# Patient Record
Sex: Male | Born: 1955 | Race: White | Hispanic: No | Marital: Married | Smoking: Former smoker
Health system: Southern US, Community
[De-identification: ages and names within clinical notes are randomized; demographics above are authoritative.]

## PROBLEM LIST (undated history)

## (undated) DIAGNOSIS — Z87442 Personal history of urinary calculi: Secondary | ICD-10-CM

## (undated) DIAGNOSIS — M1990 Unspecified osteoarthritis, unspecified site: Secondary | ICD-10-CM

## (undated) HISTORY — PX: HERNIA REPAIR: SHX51

## (undated) HISTORY — PX: SHOULDER SURGERY: SHX246

---

## 2008-06-18 ENCOUNTER — Ambulatory Visit (HOSPITAL_BASED_OUTPATIENT_CLINIC_OR_DEPARTMENT_OTHER): Admission: RE | Admit: 2008-06-18 | Discharge: 2008-06-19 | Payer: Self-pay | Admitting: Orthopedic Surgery

## 2010-06-13 NOTE — Op Note (Signed)
NAME:  Benjamin Lynn, Benjamin Lynn NO.:  [id number]   MEDICAL RECORD NO.:  [id number]          PATIENT TYPE:  AMB   LOCATION:  DSC                          FACILITY:  [facility]   PHYSICIAN:  Harvie Junior, M.D.   DATE OF BIRTH:  [birth year]   DATE OF PROCEDURE:  06/18/2008  DATE OF DISCHARGE:                               OPERATIVE REPORT   PREOPERATIVE DIAGNOSES:  1. Rotator cuff tear.  2. Biceps tendon fray.  3. Impingement.  4. Acromioclavicular joint arthritis.   POSTOPERATIVE DIAGNOSES:  1. Rotator cuff tear.  2. Biceps tendon fray.  3. Impingement.  4. Acromioclavicular joint arthritis.   PERTINENT PROCEDURES:  1. Mini open rotator cuff repair.  2. Arthroscopic subacromial decompression from a lateral and posterior      compartment.  3. Open bicipital tenodesis.  4. Major debridement in the glenohumeral joint of the biceps tendon      and superior labrum tears.   SURGEON:  Harvie Junior, M.D.   ASSISTANT:  Marshia Ly, PA   ANESTHESIA:  General.   BRIEF HISTORY:  Mr. Baba is a 55 year old male with a long history of  having had significant right shoulder pain.  He had been treated  conservatively for a period of time.  Because of continued complaints of  pain, MRI was obtained which showed that he had a rotator cuff tear,  impingement AC joint arthritis, and there was some question about the  biceps tendon and subscap from his MRI as well.  We had a long  discussion with the patient about treatment options, ultimately felt  that given his age and activity level that it was most appropriate to  have him undergo rotator cuff repair and he is brought to the operating  room for this procedure.   PROCEDURE:  The patient was brought to the operating room and after  adequate anesthesia was obtained under general anesthetic, the patient  was placed supine on the operating table and moved to the beach chair  position, all bony prominences were well padded.   Attention was turned  to the right shoulder, it was prepped and draped in usual sterile  fashion and following this, routine arthroscopic examination of the  shoulder revealed that there was some significant fraying of the biceps  tendon at the superior labrum.  The superior labrum appeared to be torn.  Collene Mares was used in the superior labrum and really the biceps tendon with  minimal shaving, essentially was released completely from the superior  glenoid tubercle.  The superior labrum anterior to posterior was then  debrided at this level.  Attention was turned to the subscap in the  front, which looks to be well anchored to the bone.  Attention was  turned to the supraspinatus which did have some high-grade partial  thickness tearing on the undersurface.  We identified this area and the  fact that the biceps tendon was freed up and then we went up top to look  for a rotator cuff tear.  At this point, we moved out of the  glenohumeral joint into the subacromial space  and anterolateral  acromioplasty was performed from lateral and posterior compartment.  A  distal clavicle resection over 18 mm was performed from the anterior  compartment.  The attention was then turned to the rotator cuff from the  top side.  The rotator cuff unfortunately showed a full-thickness tear  from the top side and once we saw this we felt that given the biceps  tendon release on the inside that we needed a mini open rotator cuff  repair and this was undertaken.  Incision was made.  Subcutaneous tissue  was taken down to level of the deltoid fibers, which were divided in  line with the fibers and the retractor was put in place.  The torn  portion of the rotator cuff was then be identified and at this point,  this was freshened up around the edges.  We did open the bicipital  groove and fish out the torn portion of the biceps tendon and at this  point, the bicipital groove was verified and the suture anchor was   placed in the bicipital groove and the biceps tendon was tenodesed at  this level with a #2 FiberWire sutures x2.  Attention was then turned to  the supraspinatus bleeding edge tear which essentially went from the  midportion of the supraspinatus anteriorly to the biceps tendon.  Single  anchor was placed just medial to the glenoid and we roughened up the  bone just medial to the humeral head articular surface.  We roughened up  the bone from the humeral head articular surface to the greater  tuberosity.  At this point, the rotator cuff was easily mobile and a  suture anchor was placed just lateral to the articular margin and about  17 mm back from the edge of the rotator cuff, we placed 2 sutures in the  cuff giving excellent fixation and purchase to the suture anchor.  We  then used a PushLock anchor to give a double row repair over the  supraspinatus tendon.  At this point, the wound was copiously and  thoroughly irrigated.  A gloved finger was placed up on to the acromion.  Excellent decompression had been achieved at this point.  The wound was  again irrigated, suction dried.  The deltoid was closed with 1 Vicryl  running.  The skin with 0 and 2-0 Vicryl and 3-0 Monocryl subcuticular  stitch and Benzoin and Steri-Strips were applied.  Sterile compressive  dressing was applied, and the patient taken to the recovery room and was  noted to be in satisfactory condition.  Estimated blood loss for this  procedure was less than 25 mL.      Harvie Junior, M.D.  Electronically Signed     JLG/MEDQ  D:  06/18/2008  T:  06/19/2008  Job:  [id number]

## 2015-04-08 ENCOUNTER — Emergency Department (HOSPITAL_COMMUNITY)
Admission: EM | Admit: 2015-04-08 | Discharge: 2015-04-08 | Disposition: A | Discharge status: Home / Self Care | Payer: BLUE CROSS/BLUE SHIELD | Attending: Emergency Medicine | Admitting: Emergency Medicine

## 2015-04-08 ENCOUNTER — Emergency Department (HOSPITAL_COMMUNITY): Payer: BLUE CROSS/BLUE SHIELD

## 2015-04-08 ENCOUNTER — Encounter (HOSPITAL_COMMUNITY): Payer: Self-pay | Admitting: *Deleted

## 2015-04-08 DIAGNOSIS — Z87442 Personal history of urinary calculi: Secondary | ICD-10-CM | POA: Diagnosis not present

## 2015-04-08 DIAGNOSIS — Z87891 Personal history of nicotine dependence: Secondary | ICD-10-CM | POA: Insufficient documentation

## 2015-04-08 DIAGNOSIS — R079 Chest pain, unspecified: Secondary | ICD-10-CM | POA: Insufficient documentation

## 2015-04-08 LAB — D-DIMER, QUANTITATIVE (NOT AT ARMC)

## 2015-04-08 LAB — BASIC METABOLIC PANEL
ANION GAP: 11 (ref 5–15)
BUN: 14 mg/dL (ref 6–20)
CHLORIDE: 103 mmol/L (ref 101–111)
CO2: 25 mmol/L (ref 22–32)
Calcium: 9.5 mg/dL (ref 8.9–10.3)
Creatinine, Ser: 0.67 mg/dL (ref 0.61–1.24)
GFR calc non Af Amer: >60 mL/min (ref >60)
Glucose, Bld: 110 mg/dL — ABNORMAL HIGH (ref 65–99)
POTASSIUM: 4 mmol/L (ref 3.5–5.1)
SODIUM: 139 mmol/L (ref 135–145)

## 2015-04-08 LAB — CBC
HEMATOCRIT: 45.9 % (ref 39.0–52.0)
HEMOGLOBIN: 15.5 g/dL (ref 13.0–17.0)
MCH: 29.5 pg (ref 26.0–34.0)
MCHC: 33.8 g/dL (ref 30.0–36.0)
MCV: 87.3 fL (ref 78.0–100.0)
Platelets: 136 10*3/uL — ABNORMAL LOW (ref 150–400)
RBC: 5.26 MIL/uL (ref 4.22–5.81)
RDW: 13 % (ref 11.5–15.5)
WBC: 5.3 10*3/uL (ref 4.0–10.5)

## 2015-04-08 LAB — I-STAT TROPONIN, ED
TROPONIN I, POC: 0 ng/mL (ref 0.00–0.08)
Troponin i, poc: 0 ng/mL (ref 0.00–0.08)

## 2015-04-08 NOTE — ED Provider Notes (Addendum)
CSN: [id number]     Arrival date & time 04/08/15  1248 History   First MD Initiated Contact with Patient 04/08/15 1305     Chief Complaint  Patient presents with  . Chest Pain      HPI Patient presents with left-sided chest pain which started around 11 AM this morning.  Patient was at work and was not under any exertion.  Patient denies shortness of breath, nausea vomiting.  Denies diaphoresis.  Was given aspirin and nitroglycerin prior to arrival and chest pain is down to 1 out of 10. Past Medical History  Diagnosis Date  . Kidney stone    Past Surgical History  Procedure Laterality Date  . Shoulder surgery     Family History  Problem Relation Age of Onset  . Heart attack Father    Social History  Substance Use Topics  . Smoking status: Former Smoker    Quit date: 01/29/1993  . Smokeless tobacco: None  . Alcohol Use: No    Review of Systems  All other systems reviewed and are negative  Allergies  Review of patient's allergies indicates no known allergies.  Home Medications   Prior to Admission medications   Not on File   BP 122/69 mmHg  Pulse 75  Temp(Src) 98.2 F (36.8 C) (Oral)  Resp 16  SpO2 98% Physical Exam Physical Exam  Nursing note and vitals reviewed. Constitutional: He is oriented to person, place, and time. He appears well-developed and well-nourished. No distress.  HENT:  Head: Normocephalic and atraumatic.  Eyes: Pupils are equal, round, and reactive to light.  Neck: Normal range of motion.  Cardiovascular: Normal rate and intact distal pulses.   Pulmonary/Chest: No respiratory distress.  Abdominal: Normal appearance. He exhibits no distension.  Musculoskeletal: Normal range of motion.  Neurological: He is alert and oriented to person, place, and time. No cranial nerve deficit.  Skin: Skin is warm and dry. No rash noted.  Psychiatric: He has a normal mood and affect. His behavior is normal.   ED Course  Procedures (including critical  care time) Labs Review Labs Reviewed  CBC - Abnormal; Notable for the following:    Platelets 136 (*)    All other components within normal limits  BASIC METABOLIC PANEL  D-DIMER, QUANTITATIVE (NOT AT [facility])  Rosezena SensorI-STAT TROPOININ, ED    Imaging Review Dg Chest 2 View  04/08/2015  CLINICAL DATA:  Patient with left-sided chest pain. EXAM: CHEST  2 VIEW COMPARISON:  None. FINDINGS: Normal cardiac and mediastinal contours. No consolidative pulmonary opacities. No pleural effusion or pneumothorax. Thoracic spine degenerative changes. Osteolysis distal right clavicle. IMPRESSION: No active cardiopulmonary disease. Nonspecific osteolysis distal right clavicle, potentially secondary to posttraumatic deformity. Recommend clinical correlation. Electronically Signed   By: Annia Beltrew  Davis M.D.   On: 04/08/2015 13:18   I have personally reviewed and evaluated these images and lab results as part of my medical decision-making.   EKG Interpretation   Date/Time:  Friday April 08 2015 12:54:33 EST Ventricular Rate:  72 PR Interval:  129 QRS Duration: 105 QT Interval:  405 QTC Calculation: 443 R Axis:   70 Text Interpretation:  Sinus rhythm Ventricular premature complex Confirmed  by Radford PaxBEATON  MD, Michelle Vanhise ([id number]) on 04/08/2015 1:31:52 PM      HEART SCORE=3 MDM   Final diagnoses:  None        Nelva Nayobert Abriel Geesey, MD 04/08/15 1356  Nelva Nayobert Aayla Marrocco, MD 04/17/15 [phone]

## 2015-04-08 NOTE — Discharge Instructions (Signed)
Aspirin and Your Heart  Aspirin is a medicine that affects the way blood clots. Aspirin can be used to help reduce the risk of blood clots, heart attacks, and other heart-related problems.  SHOULD I TAKE ASPIRIN? Your health care provider will help you determine whether it is safe and beneficial for you to take aspirin daily. Taking aspirin daily may be beneficial if you:  Have had a heart attack or chest pain.  Have undergone open heart surgery such as coronary artery bypass surgery (CABG).  Have had coronary angioplasty.  Have experienced a stroke or transient ischemic attack (TIA).  Have peripheral vascular disease (PVD).  Have chronic heart rhythm problems such as atrial fibrillation. ARE THERE ANY RISKS OF TAKING ASPIRIN DAILY? Daily use of aspirin can increase your risk of side effects. Some of these include:  Bleeding. Bleeding problems can be minor or serious. An example of a minor problem is a cut that does not stop bleeding. An example of a more serious problem is stomach bleeding or bleeding into the brain. Your risk of bleeding is increased if you are also taking non-steroidal anti-inflammatory medicine (NSAIDs).  Increased bruising.  Upset stomach.  An allergic reaction. People who have nasal polyps have an increased risk of developing an aspirin allergy. WHAT ARE SOME GUIDELINES I SHOULD FOLLOW WHEN TAKING ASPIRIN?   Take aspirin only as directed by your health care provider. Make sure you understand how much you should take and what form you should take. The two forms of aspirin are:  Non-enteric-coated. This type of aspirin does not have a coating and is absorbed quickly. Non-enteric-coated aspirin is usually recommended for people with chest pain. This type of aspirin also comes in a chewable form.  Enteric-coated. This type of aspirin has a special coating that releases the medicine very slowly. Enteric-coated aspirin causes less stomach upset than non-enteric-coated  aspirin. This type of aspirin should not be chewed or crushed.  Drink alcohol in moderation. Drinking alcohol increases your risk of bleeding. WHEN SHOULD I SEEK MEDICAL CARE?   You have unusual bleeding or bruising.  You have stomach pain.  You have an allergic reaction. Symptoms of an allergic reaction include:  Hives.  Itchy skin.  Swelling of the lips, tongue, or face.  You have ringing in your ears. WHEN SHOULD I SEEK IMMEDIATE MEDICAL CARE?   Your bowel movements are bloody, dark red, or black in color.  You vomit or cough up blood.  You have blood in your urine.  You cough, wheeze, or feel short of breath. If you have any of the following symptoms, this is an emergency. Do not wait to see if the pain will go away. Get medical help at once. Call your local emergency services (911 in the [place] Do not drive yourself to the hospital.  You have severe chest pain, especially if the pain is crushing or pressure-like and spreads to the arms, back, neck, or jaw.  You have stroke-like symptoms, such as:   Loss of vision.   Difficulty talking.   Numbness or weakness on one side of your body.   Numbness or weakness in your arm or leg.   Not thinking clearly or feeling confused.    This information is not intended to replace advice given to you by your health care provider. Make sure you discuss any questions you have with your health care provider.   Document Released: 12/29/2007 Document Revised: 02/05/2014 Document Reviewed: 04/22/2013 Elsevier Interactive Patient Education 2016 Elsevier  Inc. ° °Nonspecific Chest Pain °It is often hard to find the cause of chest pain. There is always a chance that your pain could be related to something serious, such as a heart attack or a blood clot in your lungs. Chest pain can also be caused by conditions that are not life-threatening. If you have chest pain, it is very important to follow up with your doctor. ° °HOME CARE °· If  you were prescribed an antibiotic medicine, finish it all even if you start to feel better. °· Avoid any activities that cause chest pain. °· Do not use any tobacco products, including cigarettes, chewing tobacco, or electronic cigarettes. If you need help quitting, ask your doctor. °· Do not drink alcohol. °· Take medicines only as told by your doctor. °· Keep all follow-up visits as told by your doctor. This is important. This includes any further testing if your chest pain does not go away. °· Your doctor may tell you to keep your head raised (elevated) while you sleep. °· Make lifestyle changes as told by your doctor. These may include: °¨ Getting regular exercise. Ask your doctor to suggest some activities that are safe for you. °¨ Eating a heart-healthy diet. Your doctor or a diet specialist (dietitian) can help you to learn healthy eating options. °¨ Maintaining a healthy weight. °¨ Managing diabetes, if necessary. °¨ Reducing stress. °GET HELP IF: °· Your chest pain does not go away, even after treatment. °· You have a rash with blisters on your chest. °· You have a fever. °GET HELP RIGHT AWAY IF: °· Your chest pain is worse. °· You have an increasing cough, or you cough up blood. °· You have severe belly (abdominal) pain. °· You feel extremely weak. °· You pass out (faint). °· You have chills. °· You have sudden, unexplained chest discomfort. °· You have sudden, unexplained discomfort in your arms, back, neck, or jaw. °· You have shortness of breath at any time. °· You suddenly start to sweat, or your skin gets clammy. °· You feel nauseous. °· You vomit. °· You suddenly feel light-headed or dizzy. °· Your heart begins to beat quickly, or it feels like it is skipping beats. °These symptoms may be an emergency. Do not wait to see if the symptoms will go away. Get medical help right away. Call your local emergency services (911 in the [place] Do not drive yourself to the hospital. °  °This information is not  intended to replace advice given to you by your health care provider. Make sure you discuss any questions you have with your health care provider. °  °Document Released: 07/04/2007 Document Revised: 02/05/2014 Document Reviewed: 08/21/2013 °Elsevier Interactive Patient Education ©2016 Elsevier Inc. ° °

## 2015-04-08 NOTE — ED Notes (Signed)
Patient placed back on monitor, continuous pulse oximetry and blood pressure cuff; visitor at bedside; Radford PaxBeaton, MD speaking with patient at bedside as well

## 2015-04-08 NOTE — ED Notes (Addendum)
Patient undressed, in gown, on monitor, continuous pulse oximetry and blood pressure cuff 

## 2015-04-08 NOTE — ED Provider Notes (Signed)
Sign off from Dr. Radford PaxBeaton. Patient with resolved chest pain awaiting second troponin.  Second troponin is negative. He remains pain-free. Will be discharged according to plan established by Dr. Radford PaxBeaton.  Benjamin OctaveStephen Meghna Hagmann, MD 04/08/15 [phone]

## 2015-04-08 NOTE — ED Notes (Signed)
Patient transported to X-ray 

## 2015-04-08 NOTE — ED Notes (Signed)
Pt reports left sided chest pain that started while at work today approx 2hrs pta. Pt states he was at rest at onset of chest pain, denies SOB, N/V or dizziness. Pt took 324mg  ASA prior to EMS arrival - x1 nitro given by EMS en route, pt down from 5/10 to 3/10 chest pain.

## 2016-08-08 ENCOUNTER — Encounter (HOSPITAL_COMMUNITY): Payer: Self-pay | Admitting: *Deleted

## 2016-08-10 ENCOUNTER — Ambulatory Visit: Payer: Self-pay | Admitting: Orthopedic Surgery

## 2016-08-15 NOTE — H&P (Signed)
TOTAL KNEE ADMISSION H&P  Patient is being admitted for right total knee arthroplasty.  Subjective:  Chief Complaint:right knee pain.  HPI: Benjamin Lynn, 61 y.o. male, has a history of pain and functional disability in the right knee due to arthritis and has failed non-surgical conservative treatments for greater than 12 weeks to includecorticosteriod injections, viscosupplementation injections, supervised PT with diminished ADL's post treatment, use of assistive devices, weight reduction as appropriate and activity modification.  Onset of symptoms was gradual, starting 2 years ago with gradually worsening course since that time. The patient noted no past surgery on the right knee(s).  Patient currently rates pain in the right knee(s) at 8 out of 10 with activity. Patient has worsening of pain with activity and weight bearing, pain that interferes with activities of daily living, pain with passive range of motion and joint swelling.  Patient has evidence of subchondral sclerosis, periarticular osteophytes and joint space narrowing by imaging studies. This patient has had previous injury to right knee. There is no active infection.  There are no active problems to display for this patient.  Past Medical History:  Diagnosis Date  . Kidney stone     Past Surgical History:  Procedure Laterality Date  . SHOULDER SURGERY      No prescriptions prior to admission.   No Known Allergies  Social History  Substance Use Topics  . Smoking status: Former Smoker    Quit date: 01/29/1993  . Smokeless tobacco: Not on file  . Alcohol use No    Family History  Problem Relation Age of Onset  . Heart attack Father      Review of Systems  Constitutional: Negative.   HENT: Negative.   Eyes: Negative.   Respiratory: Negative.   Cardiovascular: Negative.   Gastrointestinal: Negative.   Genitourinary: Negative.   Musculoskeletal: Positive for back pain and joint pain.  Skin: Negative.    Neurological: Negative.   Endo/Heme/Allergies: Negative.   Psychiatric/Behavioral: Negative.     Objective:  Physical Exam  Constitutional: He is oriented to person, place, and time. He appears well-developed.  HENT:  Head: Normocephalic.  Eyes: EOM are normal.  Neck: Normal range of motion.  Cardiovascular: Normal rate.   Respiratory: Effort normal.  GI: Soft.  Genitourinary:  Genitourinary Comments: Deferred  Musculoskeletal:  Right knee limited rom. RLE grossly n/v intact. Right knee stable.  Neurological: He is alert and oriented to person, place, and time.  Skin: Skin is warm and dry.  Psychiatric: His behavior is normal.    Vital signs in last 24 hours: BP: ()/()  Arterial Line BP: ()/()   Labs:   There is no height or weight on file to calculate BMI.   Imaging Review Plain radiographs demonstrate moderate degenerative joint disease of the right knee(s). The overall alignment ismild varus. The bone quality appears to be good for age and reported activity level.  Assessment/Plan:  End stage arthritis, right knee   The patient history, physical examination, clinical judgment of the provider and imaging studies are consistent with end stage degenerative joint disease of the right knee(s) and total knee arthroplasty is deemed medically necessary. The treatment options including medical management, injection therapy arthroscopy and arthroplasty were discussed at length. The risks and benefits of total knee arthroplasty were presented and reviewed. The risks due to aseptic loosening, infection, stiffness, patella tracking problems, thromboembolic complications and other imponderables were discussed. The patient acknowledged the explanation, agreed to proceed with the plan and consent was signed. Patient  is being admitted for inpatient treatment for surgery, pain control, PT, OT, prophylactic antibiotics, VTE prophylaxis, progressive ambulation and ADL's and discharge  planning. The patient is planning to be discharged home with home health services.  Will use IV tranexamic acid. Contraindications and adverse affects of Tranexamic acid discussed in detail. Patient denies any of these at this time and understands the risks and benefits.

## 2016-08-21 NOTE — Patient Instructions (Addendum)
Alleen BorneKenneth Derosia  08/21/2016   Your procedure is scheduled on: 08/30/2016   [facility]  Entrance [facility] [place]  elevators to 3rd floor to  Short Stay Center at    1030 AM.   Call this number if you have problems the morning of surgery [phone]    Remember: ONLY 1 PERSON MAY GO WITH YOU TO SHORT STAY TO GET  READY MORNING OF YOUR SURGERY.  Do not eat food or drink liquids :After Midnight.     Take these medicines the morning of surgery with A SIP OF WATER: none                                 You may not have any metal on your body including hair pins and              piercings  Do not wear jewelry,  lotions, powders or perfumes, deodorant            .              Men may shave face and neck.   Do not bring valuables to the hospital. [facility] IS NOT             RESPONSIBLE   FOR VALUABLES.  Contacts, dentures or bridgework may not be worn into surgery.  Leave suitcase in the car. After surgery it may be brought to your room.                   Please read over the following fact sheets you were given: _____________________________________________________________________             [facility] - Preparing for Surgery Before surgery, you can play an important role.  Because skin is not sterile, your skin needs to be as free of germs as possible.  You can reduce the number of germs on your skin by washing with CHG (chlorahexidine gluconate) soap before surgery.  CHG is an antiseptic cleaner which kills germs and bonds with the skin to continue killing germs even after washing. Please DO NOT use if you have an allergy to CHG or antibacterial soaps.  If your skin becomes reddened/irritated stop using the CHG and inform your nurse when you arrive at Short Stay. Do not shave (including legs and underarms) for at least 48 hours prior to the first CHG shower.  You may shave your face/neck. Please follow these instructions carefully:  1.  Shower  with CHG Soap the night before surgery and the  morning of Surgery.  2.  If you choose to wash your hair, wash your hair first as usual with your  normal  shampoo.  3.  After you shampoo, rinse your hair and body thoroughly to remove the  shampoo.                           4.  Use CHG as you would any other liquid soap.  You can apply chg directly  to the skin and wash                       Gently with a scrungie or clean washcloth.  5.  Apply the CHG Soap to your body ONLY FROM THE NECK DOWN.  Do not use on face/ open                           Wound or open sores. Avoid contact with eyes, ears mouth and genitals (private parts).                       Wash face,  Genitals (private parts) with your normal soap.             6.  Wash thoroughly, paying special attention to the area where your surgery  will be performed.  7.  Thoroughly rinse your body with warm water from the neck down.  8.  DO NOT shower/wash with your normal soap after using and rinsing off  the CHG Soap.                9.  Pat yourself dry with a clean towel.            10.  Wear clean pajamas.            11.  Place clean sheets on your bed the night of your first shower and do not  sleep with pets. Day of Surgery : Do not apply any lotions/deodorants the morning of surgery.  Please wear clean clothes to the hospital/surgery center.  FAILURE TO FOLLOW THESE INSTRUCTIONS MAY RESULT IN THE CANCELLATION OF YOUR SURGERY PATIENT SIGNATURE_________________________________  NURSE SIGNATURE__________________________________  ________________________________________________________________________  WHAT IS A BLOOD TRANSFUSION? Blood Transfusion Information  A transfusion is the replacement of blood or some of its parts. Blood is made up of multiple cells which provide different functions.  Red blood cells carry oxygen and are used for blood loss replacement.  White blood cells fight against infection.  Platelets control  bleeding.  Plasma helps clot blood.  Other blood products are available for specialized needs, such as hemophilia or other clotting disorders. BEFORE THE TRANSFUSION  Who gives blood for transfusions?   Healthy volunteers who are fully evaluated to make sure their blood is safe. This is blood bank blood. Transfusion therapy is the safest it has ever been in the practice of medicine. Before blood is taken from a donor, a complete history is taken to make sure that person has no history of diseases nor engages in risky social behavior (examples are intravenous drug use or sexual activity with multiple partners). The donor's travel history is screened to minimize risk of transmitting infections, such as malaria. The donated blood is tested for signs of infectious diseases, such as HIV and hepatitis. The blood is then tested to be sure it is compatible with you in order to minimize the chance of a transfusion reaction. If you or a relative donates blood, this is often done in anticipation of surgery and is not appropriate for emergency situations. It takes many days to process the donated blood. RISKS AND COMPLICATIONS Although transfusion therapy is very safe and saves many lives, the main dangers of transfusion include:   Getting an infectious disease.  Developing a transfusion reaction. This is an allergic reaction to something in the blood you were given. Every precaution is taken to prevent this. The decision to have a blood transfusion has been considered carefully by your caregiver before blood is given. Blood is not given unless the benefits outweigh the risks. AFTER THE TRANSFUSION  Right after receiving a blood transfusion, you will usually feel much better and more energetic. This is especially  true if your red blood cells have gotten low (anemic). The transfusion raises the level of the red blood cells which carry oxygen, and this usually causes an energy increase.  The nurse  administering the transfusion will monitor you carefully for complications. HOME CARE INSTRUCTIONS  No special instructions are needed after a transfusion. You may find your energy is better. Speak with your caregiver about any limitations on activity for underlying diseases you may have. SEEK MEDICAL CARE IF:   Your condition is not improving after your transfusion.  You develop redness or irritation at the intravenous (IV) site. SEEK IMMEDIATE MEDICAL CARE IF:  Any of the following symptoms occur over the next 12 hours:  Shaking chills.  You have a temperature by mouth above 102 F (38.9 C), not controlled by medicine.  Chest, back, or muscle pain.  People around you feel you are not acting correctly or are confused.  Shortness of breath or difficulty breathing.  Dizziness and fainting.  You get a rash or develop hives.  You have a decrease in urine output.  Your urine turns a dark color or changes to pink, red, or brown. Any of the following symptoms occur over the next 10 days:  You have a temperature by mouth above 102 F (38.9 C), not controlled by medicine.  Shortness of breath.  Weakness after normal activity.  The white part of the eye turns yellow (jaundice).  You have a decrease in the amount of urine or are urinating less often.  Your urine turns a dark color or changes to pink, red, or brown. Document Released: 01/13/2000 Document Revised: 04/09/2011 Document Reviewed: 09/01/2007 ExitCare Patient Information 2014 [place].  _______________________________________________________________________  Incentive Spirometer  An incentive spirometer is a tool that can help keep your lungs clear and active. This tool measures how well you are filling your lungs with each breath. Taking long deep breaths may help reverse or decrease the chance of developing breathing (pulmonary) problems (especially infection) following:  A long period of time when you are  unable to move or be active. BEFORE THE PROCEDURE   If the spirometer includes an indicator to show your best effort, your nurse or respiratory therapist will set it to a desired goal.  If possible, sit up straight or lean slightly forward. Try not to slouch.  Hold the incentive spirometer in an upright position. INSTRUCTIONS FOR USE  1. Sit on the edge of your bed if possible, or sit up as far as you can in bed or on a chair. 2. Hold the incentive spirometer in an upright position. 3. Breathe out normally. 4. Place the mouthpiece in your mouth and seal your lips tightly around it. 5. Breathe in slowly and as deeply as possible, raising the piston or the ball toward the top of the column. 6. Hold your breath for 3-5 seconds or for as long as possible. Allow the piston or ball to fall to the bottom of the column. 7. Remove the mouthpiece from your mouth and breathe out normally. 8. Rest for a few seconds and repeat Steps 1 through 7 at least 10 times every 1-2 hours when you are awake. Take your time and take a few normal breaths between deep breaths. 9. The spirometer may include an indicator to show your best effort. Use the indicator as a goal to work toward during each repetition. 10. After each set of 10 deep breaths, practice coughing to be sure your lungs are clear. If you have  an incision (the cut made at the time of surgery), support your incision when coughing by placing a pillow or rolled up towels firmly against it. Once you are able to get out of bed, walk around indoors and cough well. You may stop using the incentive spirometer when instructed by your caregiver.  RISKS AND COMPLICATIONS  Take your time so you do not get dizzy or light-headed.  If you are in pain, you may need to take or ask for pain medication before doing incentive spirometry. It is harder to take a deep breath if you are having pain. AFTER USE  Rest and breathe slowly and easily.  It can be helpful to  keep track of a log of your progress. Your caregiver can provide you with a simple table to help with this. If you are using the spirometer at home, follow these instructions: [place] IF:   You are having difficultly using the spirometer.  You have trouble using the spirometer as often as instructed.  Your pain medication is not giving enough relief while using the spirometer.  You develop fever of 100.5 F (38.1 C) or higher. SEEK IMMEDIATE MEDICAL CARE IF:   You cough up bloody sputum that had not been present before.  You develop fever of 102 F (38.9 C) or greater.  You develop worsening pain at or near the incision site. MAKE SURE YOU:   Understand these instructions.  Will watch your condition.  Will get help right away if you are not doing well or get worse. Document Released: 05/28/2006 Document Revised: 04/09/2011 Document Reviewed: 07/29/2006 [facility] Patient Information 2014 [place], [place].   ________________________________________________________________________

## 2016-08-22 ENCOUNTER — Encounter (HOSPITAL_COMMUNITY)
Admission: RE | Admit: 2016-08-22 | Discharge: 2016-08-22 | Disposition: A | Discharge status: Home / Self Care | Payer: BLUE CROSS/BLUE SHIELD | Source: Ambulatory Visit | Attending: Specialist | Admitting: Specialist

## 2016-08-22 ENCOUNTER — Encounter (HOSPITAL_COMMUNITY): Payer: Self-pay

## 2016-08-22 DIAGNOSIS — Z01812 Encounter for preprocedural laboratory examination: Secondary | ICD-10-CM | POA: Insufficient documentation

## 2016-08-22 HISTORY — DX: Unspecified osteoarthritis, unspecified site: M19.90

## 2016-08-22 HISTORY — DX: Personal history of urinary calculi: Z87.442

## 2016-08-22 LAB — URINALYSIS, ROUTINE W REFLEX MICROSCOPIC
BILIRUBIN URINE: NEGATIVE
Glucose, UA: 50 mg/dL — AB
Hgb urine dipstick: NEGATIVE
Ketones, ur: 5 mg/dL — AB
Nitrite: NEGATIVE
Protein, ur: NEGATIVE mg/dL
SPECIFIC GRAVITY, URINE: 1.034 — AB (ref 1.005–1.030)
pH: 5 (ref 5.0–8.0)

## 2016-08-22 LAB — ABO/RH: ABO/RH(D): O POS

## 2016-08-22 LAB — PROTIME-INR
INR: 0.99
PROTHROMBIN TIME: 13.1 s (ref 11.4–15.2)

## 2016-08-22 LAB — CBC
HCT: 42.3 % (ref 39.0–52.0)
HEMOGLOBIN: 14.6 g/dL (ref 13.0–17.0)
MCH: 29.7 pg (ref 26.0–34.0)
MCHC: 34.5 g/dL (ref 30.0–36.0)
MCV: 86.2 fL (ref 78.0–100.0)
Platelets: 145 10*3/uL — ABNORMAL LOW (ref 150–400)
RBC: 4.91 MIL/uL (ref 4.22–5.81)
RDW: 13.2 % (ref 11.5–15.5)
WBC: 12.5 10*3/uL — ABNORMAL HIGH (ref 4.0–10.5)

## 2016-08-22 LAB — BASIC METABOLIC PANEL
ANION GAP: 9 (ref 5–15)
BUN: 16 mg/dL (ref 6–20)
CALCIUM: 9.2 mg/dL (ref 8.9–10.3)
CO2: 26 mmol/L (ref 22–32)
Chloride: 104 mmol/L (ref 101–111)
Creatinine, Ser: 0.68 mg/dL (ref 0.61–1.24)
Glucose, Bld: 110 mg/dL — ABNORMAL HIGH (ref 65–99)
Potassium: 4.1 mmol/L (ref 3.5–5.1)
Sodium: 139 mmol/L (ref 135–145)

## 2016-08-22 LAB — APTT: APTT: 29 s (ref 24–36)

## 2016-08-22 LAB — SURGICAL PCR SCREEN
MRSA, PCR: NEGATIVE
STAPHYLOCOCCUS AUREUS: NEGATIVE

## 2016-08-22 NOTE — Progress Notes (Addendum)
CBC and UA  done 08/22/16 faxed via epic to dr Thomasena Edisollins.

## 2016-08-23 ENCOUNTER — Ambulatory Visit: Payer: Self-pay | Admitting: Orthopedic Surgery

## 2016-08-23 NOTE — Progress Notes (Signed)
Clearance- Dr Delorise ShinerSeagle- 04/18/16-on chart

## 2016-08-29 NOTE — Progress Notes (Signed)
Spoke to patient and informed him of time change for surgery on 08/30/2016 and to arrive at Admitting at 1130 and may have clear liquids from midnight up until 0730 am then nothing after 0730 am until after surgery. Patient verbalized understanding.

## 2016-08-30 ENCOUNTER — Inpatient Hospital Stay (HOSPITAL_COMMUNITY)
Admission: RE | Admit: 2016-08-30 | Discharge: 2016-09-01 | DRG: 470 | Disposition: A | Discharge status: Home / Self Care | Payer: BLUE CROSS/BLUE SHIELD | Attending: Specialist | Admitting: Specialist

## 2016-08-30 ENCOUNTER — Inpatient Hospital Stay (HOSPITAL_COMMUNITY): Payer: BLUE CROSS/BLUE SHIELD | Admitting: Anesthesiology

## 2016-08-30 ENCOUNTER — Encounter (HOSPITAL_COMMUNITY)
Admission: RE | Disposition: A | Discharge status: Home / Self Care | Payer: Self-pay | Source: Home / Self Care | Attending: Specialist

## 2016-08-30 ENCOUNTER — Encounter (HOSPITAL_COMMUNITY): Payer: Self-pay | Admitting: *Deleted

## 2016-08-30 DIAGNOSIS — M1711 Unilateral primary osteoarthritis, right knee: Principal | ICD-10-CM

## 2016-08-30 DIAGNOSIS — K219 Gastro-esophageal reflux disease without esophagitis: Secondary | ICD-10-CM | POA: Diagnosis present

## 2016-08-30 DIAGNOSIS — Z6837 Body mass index (BMI) 37.0-37.9, adult: Secondary | ICD-10-CM

## 2016-08-30 DIAGNOSIS — D696 Thrombocytopenia, unspecified: Secondary | ICD-10-CM | POA: Diagnosis present

## 2016-08-30 DIAGNOSIS — Z96659 Presence of unspecified artificial knee joint: Secondary | ICD-10-CM

## 2016-08-30 DIAGNOSIS — Z87891 Personal history of nicotine dependence: Secondary | ICD-10-CM | POA: Diagnosis not present

## 2016-08-30 DIAGNOSIS — Z96651 Presence of right artificial knee joint: Secondary | ICD-10-CM

## 2016-08-30 HISTORY — PX: TOTAL KNEE ARTHROPLASTY: SHX125

## 2016-08-30 LAB — TYPE AND SCREEN
ABO/RH(D): O POS
ANTIBODY SCREEN: NEGATIVE

## 2016-08-30 SURGERY — ARTHROPLASTY, KNEE, TOTAL
Anesthesia: Spinal | Site: Knee | Laterality: Right

## 2016-08-30 MED ORDER — ASPIRIN EC 325 MG PO TBEC: 325.0000 mg | DELAYED_RELEASE_TABLET | Freq: Two times a day (BID) | ORAL | 0 refills | Status: AC

## 2016-08-30 MED ORDER — CELECOXIB 200 MG PO CAPS
200.0000 mg | ORAL_CAPSULE | Freq: Once | ORAL | Status: AC
  Administered 2016-08-30: 200 mg via ORAL

## 2016-08-30 MED ORDER — FENTANYL CITRATE (PF) 100 MCG/2ML IJ SOLN: 25.0000 ug | INTRAMUSCULAR | Status: DC | PRN

## 2016-08-30 MED ORDER — TRAZODONE HCL 50 MG PO TABS
50.0000 mg | ORAL_TABLET | Freq: Every day | ORAL | Status: DC
  Administered 2016-08-30: 50 mg via ORAL
  Filled 2016-08-30 (×2): qty 1

## 2016-08-30 MED ORDER — ONDANSETRON HCL 4 MG/2ML IJ SOLN
INTRAMUSCULAR | Status: DC | PRN
  Administered 2016-08-30: 4 mg via INTRAVENOUS

## 2016-08-30 MED ORDER — LACTATED RINGERS IV SOLN
INTRAVENOUS | Status: DC
  Administered 2016-08-30 – 2016-08-31 (×5): via INTRAVENOUS

## 2016-08-30 MED ORDER — METOCLOPRAMIDE HCL 5 MG PO TABS: 5.0000 mg | ORAL_TABLET | Freq: Three times a day (TID) | ORAL | Status: DC | PRN

## 2016-08-30 MED ORDER — PHENYLEPHRINE 40 MCG/ML (10ML) SYRINGE FOR IV PUSH (FOR BLOOD PRESSURE SUPPORT)
PREFILLED_SYRINGE | INTRAVENOUS | Status: DC | PRN
  Administered 2016-08-30 (×3): 80 ug via INTRAVENOUS

## 2016-08-30 MED ORDER — ALUM & MAG HYDROXIDE-SIMETH 200-200-20 MG/5ML PO SUSP
30.0000 mL | ORAL | Status: DC | PRN
  Administered 2016-08-31: 30 mL via ORAL
  Filled 2016-08-30: qty 30

## 2016-08-30 MED ORDER — HYDROMORPHONE HCL-NACL 0.5-0.9 MG/ML-% IV SOSY: 1.0000 mg | PREFILLED_SYRINGE | INTRAVENOUS | Status: DC | PRN

## 2016-08-30 MED ORDER — DEXAMETHASONE SODIUM PHOSPHATE 10 MG/ML IJ SOLN: 10.0000 mg | Freq: Once | INTRAMUSCULAR | Status: AC

## 2016-08-30 MED ORDER — FENTANYL CITRATE (PF) 100 MCG/2ML IJ SOLN
INTRAMUSCULAR | Status: AC
  Administered 2016-08-30: 50 ug via INTRAVENOUS
  Filled 2016-08-30: qty 2

## 2016-08-30 MED ORDER — METHOCARBAMOL 1000 MG/10ML IJ SOLN
500.0000 mg | Freq: Four times a day (QID) | INTRAVENOUS | Status: DC | PRN
  Filled 2016-08-30: qty 5

## 2016-08-30 MED ORDER — GABAPENTIN 300 MG PO CAPS
ORAL_CAPSULE | ORAL | Status: AC
  Administered 2016-08-30: 300 mg via ORAL
  Filled 2016-08-30: qty 1

## 2016-08-30 MED ORDER — MIDAZOLAM HCL 2 MG/2ML IJ SOLN
INTRAMUSCULAR | Status: AC
Start: 2016-08-30 — End: 2016-08-30
  Filled 2016-08-30: qty 2

## 2016-08-30 MED ORDER — TEMAZEPAM 15 MG PO CAPS: 15.0000 mg | ORAL_CAPSULE | Freq: Every evening | ORAL | Status: DC | PRN

## 2016-08-30 MED ORDER — PHENOL 1.4 % MT LIQD: 1.0000 | OROMUCOSAL | Status: DC | PRN

## 2016-08-30 MED ORDER — ONDANSETRON HCL 4 MG PO TABS: 4.0000 mg | ORAL_TABLET | Freq: Four times a day (QID) | ORAL | Status: DC | PRN

## 2016-08-30 MED ORDER — PROPOFOL 10 MG/ML IV BOLUS
INTRAVENOUS | Status: AC
  Filled 2016-08-30: qty 20

## 2016-08-30 MED ORDER — MENTHOL 3 MG MT LOZG: 1.0000 | LOZENGE | OROMUCOSAL | Status: DC | PRN

## 2016-08-30 MED ORDER — DEXAMETHASONE SODIUM PHOSPHATE 10 MG/ML IJ SOLN
10.0000 mg | Freq: Once | INTRAMUSCULAR | Status: AC
  Administered 2016-08-30: 10 mg via INTRAVENOUS

## 2016-08-30 MED ORDER — KETOROLAC TROMETHAMINE 30 MG/ML IJ SOLN
INTRAMUSCULAR | Status: DC | PRN
  Administered 2016-08-30: 30 mg via INTRA_ARTICULAR

## 2016-08-30 MED ORDER — STERILE WATER FOR IRRIGATION IR SOLN
Status: DC | PRN
  Administered 2016-08-30: 2000 mL

## 2016-08-30 MED ORDER — GABAPENTIN 300 MG PO CAPS
300.0000 mg | ORAL_CAPSULE | Freq: Once | ORAL | Status: AC
  Administered 2016-08-30: 300 mg via ORAL

## 2016-08-30 MED ORDER — ONDANSETRON HCL 4 MG/2ML IJ SOLN
INTRAMUSCULAR | Status: AC
  Filled 2016-08-30: qty 2

## 2016-08-30 MED ORDER — LIDOCAINE 2% (20 MG/ML) 5 ML SYRINGE
INTRAMUSCULAR | Status: AC
  Filled 2016-08-30: qty 5

## 2016-08-30 MED ORDER — SODIUM CHLORIDE 0.9 % IR SOLN
Status: DC | PRN
  Administered 2016-08-30: 3000 mL

## 2016-08-30 MED ORDER — ACETAMINOPHEN 500 MG PO TABS
1000.0000 mg | ORAL_TABLET | Freq: Once | ORAL | Status: AC
  Administered 2016-08-30: 1000 mg via ORAL

## 2016-08-30 MED ORDER — FENTANYL CITRATE (PF) 100 MCG/2ML IJ SOLN
50.0000 ug | INTRAMUSCULAR | Status: DC | PRN
  Administered 2016-08-30: 50 ug via INTRAVENOUS

## 2016-08-30 MED ORDER — PROPOFOL 10 MG/ML IV BOLUS
INTRAVENOUS | Status: AC
  Filled 2016-08-30: qty 60

## 2016-08-30 MED ORDER — TRANEXAMIC ACID 1000 MG/10ML IV SOLN
1000.0000 mg | INTRAVENOUS | Status: AC
  Administered 2016-08-30: 1000 mg via INTRAVENOUS
  Filled 2016-08-30: qty 1100

## 2016-08-30 MED ORDER — BISACODYL 5 MG PO TBEC: 5.0000 mg | DELAYED_RELEASE_TABLET | Freq: Every day | ORAL | Status: DC | PRN

## 2016-08-30 MED ORDER — MAGNESIUM CITRATE PO SOLN: 1.0000 | Freq: Once | ORAL | Status: DC | PRN

## 2016-08-30 MED ORDER — ACETAMINOPHEN 325 MG PO TABS
650.0000 mg | ORAL_TABLET | Freq: Four times a day (QID) | ORAL | Status: DC | PRN
  Administered 2016-08-31 – 2016-09-01 (×2): 650 mg via ORAL
  Filled 2016-08-30 (×2): qty 2

## 2016-08-30 MED ORDER — OXYCODONE HCL 5 MG PO TABS
5.0000 mg | ORAL_TABLET | ORAL | Status: DC | PRN
  Administered 2016-08-30: 5 mg via ORAL
  Administered 2016-08-30 – 2016-08-31 (×2): 10 mg via ORAL
  Filled 2016-08-30: qty 2
  Filled 2016-08-30: qty 1
  Filled 2016-08-30: qty 2

## 2016-08-30 MED ORDER — FERROUS SULFATE 325 (65 FE) MG PO TABS
325.0000 mg | ORAL_TABLET | Freq: Three times a day (TID) | ORAL | Status: DC
  Administered 2016-09-01: 325 mg via ORAL
  Filled 2016-08-30: qty 1

## 2016-08-30 MED ORDER — PROPOFOL 500 MG/50ML IV EMUL
INTRAVENOUS | Status: DC | PRN
  Administered 2016-08-30: 75 ug/kg/min via INTRAVENOUS

## 2016-08-30 MED ORDER — PROMETHAZINE HCL 25 MG/ML IJ SOLN: 6.2500 mg | INTRAMUSCULAR | Status: DC | PRN

## 2016-08-30 MED ORDER — ACETAMINOPHEN 500 MG PO TABS
ORAL_TABLET | ORAL | Status: AC
  Administered 2016-08-30: 1000 mg via ORAL
  Filled 2016-08-30: qty 2

## 2016-08-30 MED ORDER — BUPIVACAINE-EPINEPHRINE 0.25% -1:200000 IJ SOLN
INTRAMUSCULAR | Status: AC
  Filled 2016-08-30: qty 1

## 2016-08-30 MED ORDER — MIDAZOLAM HCL 5 MG/ML IJ SOLN
1.0000 mg | INTRAMUSCULAR | Status: DC | PRN
  Administered 2016-08-30: 2 mg via INTRAVENOUS

## 2016-08-30 MED ORDER — METHOCARBAMOL 500 MG PO TABS
500.0000 mg | ORAL_TABLET | Freq: Four times a day (QID) | ORAL | Status: DC | PRN
  Administered 2016-08-30 – 2016-09-01 (×4): 500 mg via ORAL
  Filled 2016-08-30 (×4): qty 1

## 2016-08-30 MED ORDER — ENOXAPARIN SODIUM 30 MG/0.3ML ~~LOC~~ SOLN
30.0000 mg | Freq: Two times a day (BID) | SUBCUTANEOUS | Status: DC
  Administered 2016-08-31 – 2016-09-01 (×3): 30 mg via SUBCUTANEOUS
  Filled 2016-08-30 (×3): qty 0.3

## 2016-08-30 MED ORDER — ONDANSETRON HCL 4 MG/2ML IJ SOLN
4.0000 mg | Freq: Four times a day (QID) | INTRAMUSCULAR | Status: DC | PRN
  Administered 2016-08-31: 4 mg via INTRAVENOUS
  Filled 2016-08-30: qty 2

## 2016-08-30 MED ORDER — CHLORHEXIDINE GLUCONATE 4 % EX LIQD
60.0000 mL | Freq: Once | CUTANEOUS | Status: AC
  Administered 2016-08-30: 4 via TOPICAL

## 2016-08-30 MED ORDER — CEFAZOLIN SODIUM-DEXTROSE 2-4 GM/100ML-% IV SOLN
2.0000 g | Freq: Four times a day (QID) | INTRAVENOUS | Status: AC
  Administered 2016-08-30 – 2016-08-31 (×2): 2 g via INTRAVENOUS
  Filled 2016-08-30: qty 100

## 2016-08-30 MED ORDER — SODIUM CHLORIDE 0.9 % IJ SOLN
INTRAMUSCULAR | Status: DC | PRN
  Administered 2016-08-30: 30 mL

## 2016-08-30 MED ORDER — BUPIVACAINE IN DEXTROSE 0.75-8.25 % IT SOLN
INTRATHECAL | Status: DC | PRN
  Administered 2016-08-30: 2 mL via INTRATHECAL

## 2016-08-30 MED ORDER — POLYETHYLENE GLYCOL 3350 17 G PO PACK: 17.0000 g | PACK | Freq: Every day | ORAL | Status: DC | PRN

## 2016-08-30 MED ORDER — PHENYLEPHRINE 40 MCG/ML (10ML) SYRINGE FOR IV PUSH (FOR BLOOD PRESSURE SUPPORT)
PREFILLED_SYRINGE | INTRAVENOUS | Status: AC
  Filled 2016-08-30: qty 10

## 2016-08-30 MED ORDER — ACETAMINOPHEN 650 MG RE SUPP: 650.0000 mg | Freq: Four times a day (QID) | RECTAL | Status: DC | PRN

## 2016-08-30 MED ORDER — LIDOCAINE 2% (20 MG/ML) 5 ML SYRINGE
INTRAMUSCULAR | Status: DC | PRN
  Administered 2016-08-30: 40 mg via INTRAVENOUS

## 2016-08-30 MED ORDER — DEXAMETHASONE SODIUM PHOSPHATE 10 MG/ML IJ SOLN
INTRAMUSCULAR | Status: AC
  Filled 2016-08-30: qty 1

## 2016-08-30 MED ORDER — DIPHENHYDRAMINE HCL 12.5 MG/5ML PO ELIX: 12.5000 mg | ORAL_SOLUTION | ORAL | Status: DC | PRN

## 2016-08-30 MED ORDER — CEFAZOLIN SODIUM-DEXTROSE 2-4 GM/100ML-% IV SOLN
2.0000 g | INTRAVENOUS | Status: AC
  Administered 2016-08-30: 2 g via INTRAVENOUS
  Filled 2016-08-30: qty 100

## 2016-08-30 MED ORDER — BUPIVACAINE-EPINEPHRINE (PF) 0.5% -1:200000 IJ SOLN
INTRAMUSCULAR | Status: DC | PRN
  Administered 2016-08-30: 30 mL via PERINEURAL

## 2016-08-30 MED ORDER — DOCUSATE SODIUM 100 MG PO CAPS
100.0000 mg | ORAL_CAPSULE | Freq: Two times a day (BID) | ORAL | Status: DC
  Administered 2016-08-30 – 2016-09-01 (×4): 100 mg via ORAL
  Filled 2016-08-30 (×4): qty 1

## 2016-08-30 MED ORDER — BUPIVACAINE-EPINEPHRINE 0.25% -1:200000 IJ SOLN
INTRAMUSCULAR | Status: DC | PRN
  Administered 2016-08-30: 30 mL

## 2016-08-30 MED ORDER — PHENTERMINE HCL 37.5 MG PO CAPS: 37.5000 mg | ORAL_CAPSULE | ORAL | Status: DC

## 2016-08-30 MED ORDER — CELECOXIB 200 MG PO CAPS
ORAL_CAPSULE | ORAL | Status: AC
  Administered 2016-08-30: 200 mg via ORAL
  Filled 2016-08-30: qty 1

## 2016-08-30 MED ORDER — FENTANYL CITRATE (PF) 100 MCG/2ML IJ SOLN
INTRAMUSCULAR | Status: AC
  Filled 2016-08-30: qty 2

## 2016-08-30 MED ORDER — METHOCARBAMOL 500 MG PO TABS: 500.0000 mg | ORAL_TABLET | Freq: Four times a day (QID) | ORAL | 2 refills | Status: AC | PRN

## 2016-08-30 MED ORDER — FENTANYL CITRATE (PF) 100 MCG/2ML IJ SOLN
INTRAMUSCULAR | Status: DC | PRN
  Administered 2016-08-30: 50 ug via INTRAVENOUS

## 2016-08-30 MED ORDER — METOCLOPRAMIDE HCL 5 MG/ML IJ SOLN
5.0000 mg | Freq: Three times a day (TID) | INTRAMUSCULAR | Status: DC | PRN
  Administered 2016-08-31: 10 mg via INTRAVENOUS
  Filled 2016-08-30: qty 2

## 2016-08-30 MED ORDER — SODIUM CHLORIDE 0.9 % IR SOLN
Status: DC | PRN
  Administered 2016-08-30: 1000 mL

## 2016-08-30 MED ORDER — KETOROLAC TROMETHAMINE 30 MG/ML IJ SOLN
INTRAMUSCULAR | Status: AC
  Filled 2016-08-30: qty 1

## 2016-08-30 MED ORDER — SODIUM CHLORIDE 0.9 % IJ SOLN
INTRAMUSCULAR | Status: AC
  Filled 2016-08-30: qty 50

## 2016-08-30 MED ORDER — OXYCODONE HCL 5 MG PO TABS: 5.0000 mg | ORAL_TABLET | ORAL | 0 refills | Status: AC | PRN

## 2016-08-30 SURGICAL SUPPLY — 56 items
BAG DECANTER FOR FLEXI CONT (MISCELLANEOUS) IMPLANT
BAG ZIPLOCK 12X15 (MISCELLANEOUS) ×6 IMPLANT
BANDAGE ACE 4X5 VEL STRL LF (GAUZE/BANDAGES/DRESSINGS) ×3 IMPLANT
BANDAGE ACE 6X5 VEL STRL LF (GAUZE/BANDAGES/DRESSINGS) ×3 IMPLANT
BLADE SAG 18X100X1.27 (BLADE) ×3 IMPLANT
BLADE SAW SGTL 13.0X1.19X90.0M (BLADE) ×3 IMPLANT
BOWL SMART MIX CTS (DISPOSABLE) ×3 IMPLANT
CAP KNEE TOTAL 3 SIGMA ×3 IMPLANT
CEMENT HV SMART SET (Cement) ×6 IMPLANT
COVER SURGICAL LIGHT HANDLE (MISCELLANEOUS) ×3 IMPLANT
CUFF TOURN SGL QUICK 34 (TOURNIQUET CUFF) ×2
CUFF TRNQT CYL 34X4X40X1 (TOURNIQUET CUFF) ×1 IMPLANT
DECANTER SPIKE VIAL GLASS SM (MISCELLANEOUS) ×3 IMPLANT
DERMABOND ADVANCED (GAUZE/BANDAGES/DRESSINGS) ×2
DERMABOND ADVANCED .7 DNX12 (GAUZE/BANDAGES/DRESSINGS) ×1 IMPLANT
DRAPE U-SHAPE 47X51 STRL (DRAPES) ×3 IMPLANT
DRSG AQUACEL AG ADV 3.5X10 (GAUZE/BANDAGES/DRESSINGS) ×3 IMPLANT
DRSG TEGADERM 4X4.75 (GAUZE/BANDAGES/DRESSINGS) ×3 IMPLANT
DURAPREP 26ML APPLICATOR (WOUND CARE) ×6 IMPLANT
ELECT REM PT RETURN 15FT ADLT (MISCELLANEOUS) ×3 IMPLANT
EVACUATOR 1/8 PVC DRAIN (DRAIN) ×3 IMPLANT
GAUZE SPONGE 2X2 8PLY STRL LF (GAUZE/BANDAGES/DRESSINGS) ×1 IMPLANT
GLOVE BIOGEL PI IND STRL 8 (GLOVE) ×2 IMPLANT
GLOVE BIOGEL PI INDICATOR 8 (GLOVE) ×4
GLOVE ECLIPSE 8.0 STRL XLNG CF (GLOVE) ×6 IMPLANT
GLOVE SURG ORTHO 9.0 STRL STRW (GLOVE) ×3 IMPLANT
GLOVE SURG SS PI 7.5 STRL IVOR (GLOVE) ×3 IMPLANT
GOWN STRL REUS W/TWL XL LVL3 (GOWN DISPOSABLE) ×6 IMPLANT
HANDPIECE INTERPULSE COAX TIP (DISPOSABLE) ×2
IMMOBILIZER KNEE 20 (SOFTGOODS) ×6 IMPLANT
IMMOBILIZER KNEE 20 THIGH 36 (SOFTGOODS) ×1 IMPLANT
NS IRRIG 1000ML POUR BTL (IV SOLUTION) ×3 IMPLANT
PACK TOTAL KNEE CUSTOM (KITS) ×3 IMPLANT
POSITIONER SURGICAL ARM (MISCELLANEOUS) ×3 IMPLANT
SET HNDPC FAN SPRY TIP SCT (DISPOSABLE) ×1 IMPLANT
SET PAD KNEE POSITIONER (MISCELLANEOUS) ×3 IMPLANT
SPONGE GAUZE 2X2 STER 10/PKG (GAUZE/BANDAGES/DRESSINGS) ×2
SPONGE LAP 18X18 X RAY DECT (DISPOSABLE) IMPLANT
SPONGE SURGIFOAM ABS GEL 100 (HEMOSTASIS) ×3 IMPLANT
STOCKINETTE 6  STRL (DRAPES) ×2
STOCKINETTE 6 STRL (DRAPES) ×1 IMPLANT
SUCTION FRAZIER HANDLE 12FR (TUBING) ×2
SUCTION TUBE FRAZIER 12FR DISP (TUBING) ×1 IMPLANT
SUT BONE WAX W31G (SUTURE) IMPLANT
SUT MNCRL AB 3-0 PS2 18 (SUTURE) ×3 IMPLANT
SUT VIC AB 1 CT1 27 (SUTURE) ×8
SUT VIC AB 1 CT1 27XBRD ANTBC (SUTURE) ×4 IMPLANT
SUT VIC AB 2-0 CT1 27 (SUTURE) ×4
SUT VIC AB 2-0 CT1 TAPERPNT 27 (SUTURE) ×2 IMPLANT
SUT VLOC 180 0 24IN GS25 (SUTURE) ×3 IMPLANT
SYR 50ML LL SCALE MARK (SYRINGE) ×3 IMPLANT
TAPE STRIPS DRAPE STRL (GAUZE/BANDAGES/DRESSINGS) ×3 IMPLANT
TRAY FOLEY W/METER SILVER 16FR (SET/KITS/TRAYS/PACK) ×3 IMPLANT
WATER STERILE IRR 1500ML POUR (IV SOLUTION) ×6 IMPLANT
WRAP KNEE MAXI GEL POST OP (GAUZE/BANDAGES/DRESSINGS) ×3 IMPLANT
YANKAUER SUCT BULB TIP 10FT TU (MISCELLANEOUS) ×3 IMPLANT

## 2016-08-30 NOTE — Transfer of Care (Signed)
Immediate Anesthesia Transfer of Care Note  Patient: Benjamin BorneKenneth Steuber  Procedure(s) Performed: Procedure(s): RIGHT TOTAL KNEE ARTHROPLASTY (Right)  Patient Location: PACU  Anesthesia Type:Spinal  Level of Consciousness:  sedated, patient cooperative and responds to stimulation  Airway & Oxygen Therapy:Patient Spontanous Breathing and Patient connected to face mask oxgen  Post-op Assessment:  Report given to PACU RN and Post -op Vital signs reviewed and stable  Post vital signs:  Reviewed and stable  Last Vitals:  Vitals:   08/30/16 1344 08/30/16 1346  BP:    Pulse: 76 83  Resp: (!) 21 20  Temp:      Complications: No apparent anesthesia complications

## 2016-08-30 NOTE — Interval H&P Note (Signed)
History and Physical Interval Note:  08/30/2016 2:24 PM  Benjamin Lynn  has presented today for surgery, with the diagnosis of Right knee osteoarthritis  The various methods of treatment have been discussed with the patient and family. After consideration of risks, benefits and other options for treatment, the patient has consented to  Procedure(s): RIGHT TOTAL KNEE ARTHROPLASTY (Right) as a surgical intervention .  The patient's history has been reviewed, patient examined, no change in status, stable for surgery.  I have reviewed the patient's chart and labs.  Questions were answered to the patient's satisfaction.     [name] ANDREW

## 2016-08-30 NOTE — Progress Notes (Signed)
Patient used razor to shave right  knee in past week. Injury to right knee last weekend. 0.5 cm x 0.5 cm scabbed area on knee with surrounding reddened tissue. No drainage. Dr Thomasena Edisollins to be made aware.

## 2016-08-30 NOTE — Op Note (Signed)
DATE OF SURGERY:  08/30/2016  TIME: 4:34 PM  PATIENT NAME:  Benjamin Lynn    AGE: 61 y.o.   PRE-OPERATIVE DIAGNOSIS:  Right knee osteoarthritis  POST-OPERATIVE DIAGNOSIS:  Right knee osteoarthritis  PROCEDURE:  Procedure(s): RIGHT TOTAL KNEE ARTHROPLASTY  SURGEON:  Scotlynn Noyes ANDREW  ASSISTANT:  Bryson Stilwell, [name], present and scrubbed throughout the case, critical for assistance with exposure, retraction, instrumentation, and closure.  OPERATIVE IMPLANTS: Depuy PFC Sigma Rotating Platform.  Femur size 4, Tibia size 5, Patella size 38 3-peg oval button, with a 10 mm polyethylene insert.   PREOPERATIVE INDICATIONS:   Benjamin Lynn is a 61 y.o. year old male with end stage bone on bone arthritis of the knee who failed conservative treatment and elected for Total Knee Arthroplasty.   The risks, benefits, and alternatives were discussed at length including but not limited to the risks of infection, bleeding, nerve injury, stiffness, blood clots, the need for revision surgery, cardiopulmonary complications, among others, and they were willing to proceed.  OPERATIVE DESCRIPTION:  The patient was brought to the operative room and placed in a supine position.  Spinal anesthesia was administered.  IV antibiotics were given.  The lower extremity was prepped and draped in the usual sterile fashion.  Time out was performed.  The leg was elevated and exsanguinated and the tourniquet was inflated.  Anterior quadriceps tendon splitting approach was performed.  The patella was retracted and osteophytes were removed.  The anterior horn of the medial and lateral meniscus was removed and cruciate ligaments resected.   The distal femur was opened with the drill and the intramedullary distal femoral cutting jig was utilized, set at 5 degrees resecting 10 mm off the distal femur.  Care was taken to protect the collateral ligaments.  The distal femoral sizing jig was applied, taking care to  avoid notching.  Then the 4-in-1 cutting jig was applied and the anterior and posterior femur was cut, along with the chamfer cuts.    Then the extramedullary tibial cutting jig was utilized making the appropriate cut using the anterior tibial crest as a reference building in appropriate posterior slope.  Care was taken during the cut to protect the medial and collateral ligaments.  The proximal tibia was removed along with the posterior horns of the menisci.   The posterior medial femoral osteophytes and posterior lateral femoral osteophytes were removed.    The flexion gap was then measured and was symmetric with the extension gap, measured at 10.  I completed the distal femoral preparation using the appropriate jig to prepare the box.  The patella was then measured, and cut with the saw.    The proximal tibia sized and prepared accordingly with the reamer and the punch, and then all components were trialed with the trial insert.  The knee was found to have excellent balance and full motion.    The above named components were then cemented into place and all excess cement was removed.  The trial polyethylene component was in place during cementation, and then was exchanged for the real polyethylene component.    The knee was easily taken through a range of motion and the patella tracked well and the knee irrigated copiously and the parapatellar and subcutaneous tissue closed with vicryl, and monocryl with steri strips for the skin.  The arthrotomy was closed at 90 of flexion. The wounds were dressed with sterile gauze and the tourniquet released and the patient was awakened and returned to the PACU in stable  and satisfactory condition.  There were no complications.  Total tourniquet time was 80 minutes.

## 2016-08-30 NOTE — Anesthesia Preprocedure Evaluation (Addendum)
Anesthesia Evaluation  Patient identified by MRN, date of birth, ID band Patient awake    Reviewed: Allergy & Precautions, NPO status , Patient's Chart, lab work & pertinent test results  Airway Mallampati: III  TM Distance: <3 FB Neck ROM: Full    Dental  (+) Teeth Intact, Dental Advisory Given   Pulmonary former smoker,    Pulmonary exam normal breath sounds clear to auscultation       Cardiovascular negative cardio ROS Normal cardiovascular exam Rhythm:Regular Rate:Normal     Neuro/Psych negative neurological ROS  negative psych ROS   GI/Hepatic Neg liver ROS, GERD  Medicated,  Endo/Other  Obesity   Renal/GU negative Renal ROS     Musculoskeletal  (+) Arthritis , Osteoarthritis,    Abdominal   Peds  Hematology  (+) Blood dyscrasia (Thrombocytopenia), , Plt 145k   Anesthesia Other Findings Day of surgery medications reviewed with the patient.  Reproductive/Obstetrics                           Anesthesia Physical Anesthesia Plan  ASA: II  Anesthesia Plan: Spinal   Post-op Pain Management:  Regional for Post-op pain   Induction: Intravenous  PONV Risk Score and Plan: 1 and Ondansetron, Dexamethasone and Treatment may vary due to age or medical condition  Airway Management Planned: Simple Face Mask  Additional Equipment:   Intra-op Plan:   Post-operative Plan:   Informed Consent: I have reviewed the patients History and Physical, chart, labs and discussed the procedure including the risks, benefits and alternatives for the proposed anesthesia with the patient or authorized representative who has indicated his/her understanding and acceptance.   Dental advisory given  Plan Discussed with: CRNA, Anesthesiologist and Surgeon  Anesthesia Plan Comments: (Discussed risks and benefits of and differences between spinal and general. Discussed risks of spinal including headache,  backache, failure, bleeding, infection, and nerve damage. Patient consents to spinal. Questions answered. Coagulation studies and platelet count acceptable.)       Anesthesia Quick Evaluation

## 2016-08-30 NOTE — Anesthesia Procedure Notes (Signed)
Date/Time: 08/30/2016 2:30 PM Performed by: Jhonnie GarnerMARSHALL, Kimorah Ridolfi M Oxygen Delivery Method: Simple face mask

## 2016-08-30 NOTE — Anesthesia Procedure Notes (Signed)
Spinal  Patient location during procedure: OR Start time: 08/30/2016 2:30 PM End time: 08/30/2016 2:32 PM Staffing Anesthesiologist: Cecile HearingURK, Jeanann Balinski EDWARD Performed: anesthesiologist  Preanesthetic Checklist Completed: patient identified, surgical consent, pre-op evaluation, timeout performed, IV checked, risks and benefits discussed and monitors and equipment checked Spinal Block Patient position: sitting Prep: site prepped and draped and DuraPrep Patient monitoring: continuous pulse ox and blood pressure Approach: midline Location: L3-4 Injection technique: single-shot Needle Needle type: Pencan  Needle gauge: 24 G Assessment Sensory level: T8 Additional Notes Functioning IV was confirmed and monitors were applied. Sterile prep and drape, including hand hygiene, mask and sterile gloves were used. The patient was positioned and the spine was prepped. The skin was anesthetized with lidocaine.  Free flow of clear CSF was obtained prior to injecting local anesthetic into the CSF.  The spinal needle aspirated freely following injection.  The needle was carefully withdrawn.  The patient tolerated the procedure well. Consent was obtained prior to procedure with all questions answered and concerns addressed. Risks including but not limited to bleeding, infection, nerve damage, paralysis, failed block, inadequate analgesia, allergic reaction, high spinal, itching and headache were discussed and the patient wished to proceed.   Benjamin AranStephen Yasha Tibbett, MD

## 2016-08-30 NOTE — Progress Notes (Signed)
Assisted Dr. Turk with right, ultrasound guided, adductor canal block. Side rails up, monitors on throughout procedure. See vital signs in flow sheet. Tolerated Procedure well.  

## 2016-08-30 NOTE — Anesthesia Procedure Notes (Signed)
Anesthesia Regional Block: Adductor canal block   Pre-Anesthetic Checklist: ,, timeout performed, Correct Patient, Correct Site, Correct Laterality, Correct Procedure, Correct Position, site marked, Risks and benefits discussed,  Surgical consent,  Pre-op evaluation,  At surgeon's request and post-op pain management  Laterality: Right  Prep: chloraprep       Needles:  Injection technique: Single-shot  Needle Type: Echogenic Needle     Needle Length: 9cm  Needle Gauge: 21     Additional Needles:   Procedures: ultrasound guided,,,,,,,,  Narrative:  Start time: 08/30/2016 1:27 PM End time: 08/30/2016 1:32 PM Injection made incrementally with aspirations every 5 mL.  Performed by: Personally  Anesthesiologist: Cecile HearingURK, STEPHEN EDWARD  Additional Notes: No pain on injection. No increased resistance to injection. Injection made in 5cc increments.  Good needle visualization.  Patient tolerated procedure well.

## 2016-08-30 NOTE — Anesthesia Postprocedure Evaluation (Signed)
Anesthesia Post Note  Patient: Benjamin BorneKenneth Lynn  Procedure(s) Performed: Procedure(s) (LRB): RIGHT TOTAL KNEE ARTHROPLASTY (Right)     Patient location during evaluation: PACU Anesthesia Type: Spinal Level of consciousness: oriented and awake and alert Pain management: pain level controlled Vital Signs Assessment: post-procedure vital signs reviewed and stable Respiratory status: spontaneous breathing, respiratory function stable and nonlabored ventilation Cardiovascular status: blood pressure returned to baseline and stable Postop Assessment: no headache, no backache, spinal receding, patient able to bend at knees and no signs of nausea or vomiting Anesthetic complications: no    Last Vitals:  Vitals:   08/30/16 1745 08/30/16 1759  BP: 119/78 116/72  Pulse: 75 74  Resp: 19 16  Temp: 36.4 C 36.6 C    Last Pain:  Vitals:   08/30/16 1745  TempSrc:   PainSc: 0-No pain    LLE Motor Response: Purposeful movement (08/30/16 1759)   RLE Motor Response: Purposeful movement (08/30/16 1759)        Cecile HearingStephen Edward Kunal Levario

## 2016-08-31 LAB — BASIC METABOLIC PANEL
Anion gap: 10 (ref 5–15)
BUN: 17 mg/dL (ref 6–20)
CO2: 24 mmol/L (ref 22–32)
CREATININE: 0.62 mg/dL (ref 0.61–1.24)
Calcium: 9 mg/dL (ref 8.9–10.3)
Chloride: 103 mmol/L (ref 101–111)
GFR calc Af Amer: >60 mL/min (ref >60)
GLUCOSE: 170 mg/dL — AB (ref 65–99)
Potassium: 4.1 mmol/L (ref 3.5–5.1)
SODIUM: 137 mmol/L (ref 135–145)

## 2016-08-31 LAB — CBC
HCT: 41.8 % (ref 39.0–52.0)
Hemoglobin: 14.4 g/dL (ref 13.0–17.0)
MCH: 29.8 pg (ref 26.0–34.0)
MCHC: 34.4 g/dL (ref 30.0–36.0)
MCV: 86.5 fL (ref 78.0–100.0)
PLATELETS: 143 10*3/uL — AB (ref 150–400)
RBC: 4.83 MIL/uL (ref 4.22–5.81)
RDW: 13.1 % (ref 11.5–15.5)
WBC: 15 10*3/uL — ABNORMAL HIGH (ref 4.0–10.5)

## 2016-08-31 MED ORDER — HYDROCODONE-ACETAMINOPHEN 5-325 MG PO TABS: 1.0000 | ORAL_TABLET | ORAL | 0 refills | Status: AC | PRN

## 2016-08-31 MED ORDER — HYDROCODONE-ACETAMINOPHEN 5-325 MG PO TABS
1.0000 | ORAL_TABLET | ORAL | Status: DC | PRN
  Administered 2016-08-31 – 2016-09-01 (×2): 1 via ORAL
  Filled 2016-08-31 (×2): qty 1

## 2016-08-31 NOTE — Evaluation (Signed)
Occupational Therapy Evaluation Patient Details Name: Benjamin BorneKenneth Lynn MRN: [id number] DOB: October 01, 1955 Today's Date: 08/31/2016    History of Present Illness Pt s/p R TKR    Clinical Impression   This 61 year old man was admitted for the above sx.  He will benefit from one more session of OT to further educate on shower transfer.  Pt will have 24/7 at home and he has a 3:1 commode    Follow Up Recommendations  Supervision/Assistance - 24 hour    Equipment Recommendations  None recommended by OT    Recommendations for Other Services       Precautions / Restrictions Precautions Precautions: Knee;Fall Required Braces or Orthoses: Knee Immobilizer - Right Knee Immobilizer - Right: Discontinue once straight leg raise with < 10 degree lag Restrictions Weight Bearing Restrictions: No Other Position/Activity Restrictions: WBAT      Mobility Bed Mobility Overal bed mobility: Needs Assistance Bed Mobility: Supine to Sit;Sit to Supine     Supine to sit: Supervision Sit to supine: Supervision   General bed mobility comments: HOB raised  Transfers Overall transfer level: Needs assistance Equipment used: Rolling walker (2 wheeled) Transfers: Sit to/from Stand Sit to Stand: Min guard         General transfer comment: cues for UE and LE placement    Balance                                           ADL either performed or assessed with clinical judgement   ADL Overall ADL's : Needs assistance/impaired Eating/Feeding: Independent   Grooming: Supervision/safety;Standing   Upper Body Bathing: Set up;Sitting   Lower Body Bathing: Minimal assistance;Sit to/from stand   Upper Body Dressing : Set up;Sitting   Lower Body Dressing: Moderate assistance;Sit to/from stand   Toilet Transfer: Min guard;Ambulation;BSC;RW   Toileting- ArchitectClothing Manipulation and Hygiene: Min guard;Sit to/from stand         General ADL Comments: pt stood to use urinal and  donned underwear.  He still feels funny from pain medication. Did not practice shower transfer on this visit     Vision         Perception     Praxis      Pertinent Vitals/Pain Pain Assessment: 0-10 Pain Score: 4  Pain Location: R knee Pain Descriptors / Indicators: Sore Pain Intervention(s): Limited activity within patient's tolerance;Monitored during session;Premedicated before session;Repositioned     Hand Dominance Right   Extremity/Trunk Assessment Upper Extremity Assessment Upper Extremity Assessment: Overall WFL for tasks assessed   Lower Extremity Assessment Lower Extremity Assessment: RLE deficits/detail RLE Deficits / Details: 3/5 quads with IND SLR and AAROM at knee -10 - 45       Communication Communication Communication: No difficulties   Cognition Arousal/Alertness: Awake/alert Behavior During Therapy: WFL for tasks assessed/performed Overall Cognitive Status: Within Functional Limits for tasks assessed                                     General Comments       Exercises Exercises: Total Joint Total Joint Exercises Ankle Circles/Pumps: AROM;15 reps;Supine;Both Quad Sets: AROM;Both;10 reps;Supine Heel Slides: AAROM;Right;15 reps;Supine Straight Leg Raises: AAROM;AROM;Right;10 reps;Supine   Shoulder Instructions      Home Living Family/patient expects to be discharged to:: Private residence Living Arrangements: Spouse/significant  other Available Help at Discharge: Family;Available 24 hours/day Type of Home: House Home Access: Stairs to enter Entergy CorporationEntrance Stairs-Number of Steps: 4 Entrance Stairs-Rails: Right;Left;Can reach both Home Layout: One level     Bathroom Shower/Tub: Producer, television/film/videoWalk-in shower   Bathroom Toilet: Standard     Home Equipment: Bedside commode          Prior Functioning/Environment Level of Independence: Independent;Independent with assistive device(s)        Comments: used cane as needed        OT  Problem List: Decreased activity tolerance;Pain      OT Treatment/Interventions: Self-care/ADL training;DME and/or AE instruction;Patient/family education    OT Goals(Current goals can be found in the care plan section) Acute Rehab OT Goals Patient Stated Goal: Regain IND and walk without pain OT Goal Formulation: With patient Time For Goal Achievement: 09/02/16 Potential to Achieve Goals: Good ADL Goals Pt Will Perform Tub/Shower Transfer: with min guard assist;ambulating;3 in 1;Shower transfer  OT Frequency: Min 1X/week   Barriers to D/C:            Co-evaluation              AM-PAC PT "6 Clicks" Daily Activity     Outcome Measure Help from another person eating meals?: None Help from another person taking care of personal grooming?: A Little Help from another person toileting, which includes using toliet, bedpan, or urinal?: A Little Help from another person bathing (including washing, rinsing, drying)?: A Little Help from another person to put on and taking off regular upper body clothing?: A Little Help from another person to put on and taking off regular lower body clothing?: A Lot 6 Click Score: 18   End of Session    Activity Tolerance: Patient tolerated treatment well Patient left: in bed;with call bell/phone within reach;with family/visitor present  OT Visit Diagnosis: Pain Pain - Right/Left: Right Pain - part of body: Knee                Time: 1327-1350 OT Time Calculation (min): 23 min Charges:  OT General Charges $OT Visit: 1 Procedure OT Evaluation $OT Eval Low Complexity: 1 Procedure G-Codes:     [place]ad[place] ParkMaryellen Dorsie Lynn, OTR/L [phone] 08/31/2016  Meredeth Furber 08/31/2016, 2:06 PM

## 2016-08-31 NOTE — Progress Notes (Signed)
Received a callback from [facility], they do not provide HH services only OP. [facility], requested therapist Joesph JulyBridget Bunker. Contacted [facility] for referral.

## 2016-08-31 NOTE — Progress Notes (Signed)
Physical Therapy Treatment Patient Details Name: Alleen BorneKenneth Jacobowitz MRN: [id number] DOB: 10-29-1955 Today's Date: 08/31/2016    History of Present Illness Pt s/p R TKR     PT Comments    Pt continues motivated and progressing with mobility despite taking min pain meds.   Follow Up Recommendations  Home health PT     Equipment Recommendations  Rolling walker with 5" wheels    Recommendations for Other Services OT consult     Precautions / Restrictions Precautions Precautions: Knee;Fall Required Braces or Orthoses: Knee Immobilizer - Right Knee Immobilizer - Right: Discontinue once straight leg raise with < 10 degree lag (Pt performed IND SLR) Restrictions Weight Bearing Restrictions: No Other Position/Activity Restrictions: WBAT    Mobility  Bed Mobility Overal bed mobility: Needs Assistance Bed Mobility: Supine to Sit;Sit to Supine     Supine to sit: Supervision Sit to supine: Supervision   General bed mobility comments: HOB raised  Transfers Overall transfer level: Needs assistance Equipment used: Rolling walker (2 wheeled) Transfers: Sit to/from Stand Sit to Stand: Min guard         General transfer comment: cues for UE and LE placement  Ambulation/Gait Ambulation/Gait assistance: Min guard Ambulation Distance (Feet): 100 Feet Assistive device: Rolling walker (2 wheeled) Gait Pattern/deviations: Step-to pattern;Decreased step length - right;Decreased step length - left;Shuffle;Trunk flexed Gait velocity: decr Gait velocity interpretation: Below normal speed for age/gender General Gait Details: cues for sequence, posture and position from Rohm and HaasW   Stairs            Wheelchair Mobility    Modified Rankin (Stroke Patients Only)       Balance                                            Cognition Arousal/Alertness: Awake/alert Behavior During Therapy: WFL for tasks assessed/performed Overall Cognitive Status: Within Functional  Limits for tasks assessed                                        Exercises Total Joint Exercises Ankle Circles/Pumps: AROM;15 reps;Supine;Both Quad Sets: AROM;Both;10 reps;Supine Heel Slides: AAROM;Right;15 reps;Supine Straight Leg Raises: AAROM;AROM;Right;10 reps;Supine    General Comments        Pertinent Vitals/Pain Pain Assessment: 0-10 Pain Score: 5  Pain Location: R knee Pain Descriptors / Indicators: Sore Pain Intervention(s): Limited activity within patient's tolerance;Monitored during session;Ice applied    Home Living Family/patient expects to be discharged to:: Private residence Living Arrangements: Spouse/significant other Available Help at Discharge: Family;Available 24 hours/day         Home Equipment: Bedside commode      Prior Function            PT Goals (current goals can now be found in the care plan section) Acute Rehab PT Goals Patient Stated Goal: Regain IND and walk without pain PT Goal Formulation: With patient Time For Goal Achievement: 09/04/16 Potential to Achieve Goals: Good Progress towards PT goals: Progressing toward goals    Frequency    7X/week      PT Plan Current plan remains appropriate    Co-evaluation              AM-PAC PT "6 Clicks" Daily Activity  Outcome Measure  Difficulty turning over in bed (including  adjusting bedclothes, sheets and blankets)?: A Little Difficulty moving from lying on back to sitting on the side of the bed? : A Little Difficulty sitting down on and standing up from a chair with arms (e.g., wheelchair, bedside commode, etc,.)?: A Little Help needed moving to and from a bed to chair (including a wheelchair)?: A Little Help needed walking in hospital room?: A Little Help needed climbing 3-5 steps with a railing? : A Little 6 Click Score: 18    End of Session Equipment Utilized During Treatment: Gait belt Activity Tolerance: Patient tolerated treatment well;Patient  limited by pain Patient left: in bed;with call bell/phone within reach Nurse Communication: Mobility status PT Visit Diagnosis: Difficulty in walking, not elsewhere classified (R26.2)     Time: 1540-1606 PT Time Calculation (min) (ACUTE ONLY): 26 min  Charges:  $Gait Training: 8-22 mins $Therapeutic Exercise: 8-22 mins                    G Codes:       Pg [phone]    Mollie Rossano 08/31/2016, 5:47 PM

## 2016-08-31 NOTE — Progress Notes (Addendum)
Spoke with patient at bedside. States he would like to use [facility], Mahala MenghiniKevin Cleary for HHPT. Contacted them at the number provided, left a message for them to call me back. Patient states he has canes but PT has suggested a RW, he has a 3n1 already. Contacted [facility] to deliver RW to room. Will await callback. [phone]

## 2016-08-31 NOTE — Progress Notes (Signed)
Subjective: 1 Day Post-Op Procedure(s) (LRB): RIGHT TOTAL KNEE ARTHROPLASTY (Right) Patient reports pain as well controlled.  Reports a good night. Tolerating PO. Denies CP, SOb, or calf pain.  Objective: Vital signs in last 24 hours: Temp:  [97.6 F (36.4 C)-98.5 F (36.9 C)] 97.8 F (36.6 C) (08/03 0506) Pulse Rate:  [66-87] 87 (08/03 0506) Resp:  [8-26] 15 (08/03 0506) BP: (106-138)/(60-85) 128/67 (08/03 0506) SpO2:  [94 %-100 %] 96 % (08/03 0506) Weight:  [112 kg (247 lb)] 112 kg (247 lb) (08/02 1158)  Intake/Output from previous day: 08/02 0701 - 08/03 0700 In: 3470 [P.O.:420; I.V.:2950; IV Piggyback:100] Out: 1440 [Urine:1375; Drains:15; Blood:50] Intake/Output this shift: No intake/output data recorded.   Recent Labs  08/31/16 0522  HGB 14.4    Recent Labs  08/31/16 0522  WBC 15.0*  RBC 4.83  HCT 41.8  PLT 143*    Recent Labs  08/31/16 0522  NA 137  K 4.1  CL 103  CO2 24  BUN 17  CREATININE 0.62  GLUCOSE 170*  CALCIUM 9.0   No results for input(s): LABPT, INR in the last 72 hours.  Well nourished. Alert and oriented x3. RRR, Lungs clear, BS x4. Abdomen soft and non tender. Right Calf soft and non tender. Right knee dressing C/D/I. No DVT signs. Compartment soft. No signs of infection.  Right LE grossly neurovascular intact.  Assessment/Plan: 1 Day Post-Op Procedure(s) (LRB): RIGHT TOTAL KNEE ARTHROPLASTY (Right) Up with PT Drain D/c'ed with tip intact Plan d/c home tomorrow Lovenox to [facility]  Paz Fuentes L 08/31/2016, 7:50 AM

## 2016-08-31 NOTE — Evaluation (Signed)
Physical Therapy Evaluation Patient Details Name: Benjamin BorneKenneth Cossey MRN: [id number] DOB: 1956/01/01 Today's Date: 08/31/2016   History of Present Illness  Pt s/p R TKR   Clinical Impression  Pt s/p R TKR and presents with decreased R LE strength/ROM and post op pain limiting functional mobility.  Pt should progress to dc home with family assist.    Follow Up Recommendations Home health PT    Equipment Recommendations  Rolling walker with 5" wheels    Recommendations for Other Services OT consult     Precautions / Restrictions Precautions Precautions: Knee;Fall Required Braces or Orthoses: Knee Immobilizer - Right Knee Immobilizer - Right: Discontinue once straight leg raise with < 10 degree lag (Pt performed IND SLR this am) Restrictions Weight Bearing Restrictions: No Other Position/Activity Restrictions: WBAT      Mobility  Bed Mobility Overal bed mobility: Needs Assistance Bed Mobility: Supine to Sit;Sit to Supine     Supine to sit: Min assist Sit to supine: Min assist   General bed mobility comments: cues for sequence and use of L LE to self assist  Transfers Overall transfer level: Needs assistance Equipment used: Rolling walker (2 wheeled) Transfers: Sit to/from Stand Sit to Stand: Min assist;Min guard         General transfer comment: cues for LE management and use of UEs to self assist  Ambulation/Gait Ambulation/Gait assistance: Min assist;Min guard Ambulation Distance (Feet): 56 Feet Assistive device: Rolling walker (2 wheeled) Gait Pattern/deviations: Step-to pattern;Decreased step length - right;Decreased step length - left;Shuffle;Trunk flexed Gait velocity: decr Gait velocity interpretation: Below normal speed for age/gender General Gait Details: cues for sequence, posture and position from AutoZoneW  Stairs            Wheelchair Mobility    Modified Rankin (Stroke Patients Only)       Balance                                             Pertinent Vitals/Pain Pain Assessment: 0-10 Pain Score: 6  Pain Location: R knee Pain Descriptors / Indicators: Aching;Sore Pain Intervention(s): Limited activity within patient's tolerance;Monitored during session;Patient requesting pain meds-RN notified;Ice applied    Home Living Family/patient expects to be discharged to:: Private residence Living Arrangements: Spouse/significant other Available Help at Discharge: Family Type of Home: House Home Access: Stairs to enter Entrance Stairs-Rails: Right;Left;Can reach both Entrance Stairs-Number of Steps: 4 Home Layout: One level Home Equipment: Walker - 4 wheels;Cane - single point      Prior Function Level of Independence: Independent;Independent with assistive device(s)         Comments: used cane as needed     Hand Dominance   Dominant Hand: Right    Extremity/Trunk Assessment   Upper Extremity Assessment Upper Extremity Assessment: Overall WFL for tasks assessed    Lower Extremity Assessment Lower Extremity Assessment: RLE deficits/detail RLE Deficits / Details: 3/5 quads with IND SLR and AAROM at knee -10 - 45       Communication   Communication: No difficulties  Cognition Arousal/Alertness: Awake/alert Behavior During Therapy: WFL for tasks assessed/performed Overall Cognitive Status: Within Functional Limits for tasks assessed                                        General  Comments      Exercises Total Joint Exercises Ankle Circles/Pumps: AROM;15 reps;Supine;Both Quad Sets: AROM;Both;10 reps;Supine Heel Slides: AAROM;Right;15 reps;Supine Straight Leg Raises: AAROM;AROM;Right;10 reps;Supine   Assessment/Plan    PT Assessment Patient needs continued PT services  PT Problem List Decreased strength;Decreased range of motion;Decreased activity tolerance;Decreased mobility;Pain;Decreased knowledge of use of DME       PT Treatment Interventions DME instruction;Gait  training;Stair training;Functional mobility training;Therapeutic activities;Therapeutic exercise;Patient/family education    PT Goals (Current goals can be found in the Care Plan section)  Acute Rehab PT Goals Patient Stated Goal: Regain IND and walk without pain PT Goal Formulation: With patient Time For Goal Achievement: 09/04/16 Potential to Achieve Goals: Good    Frequency 7X/week   Barriers to discharge        Co-evaluation               AM-PAC PT "6 Clicks" Daily Activity  Outcome Measure Difficulty turning over in bed (including adjusting bedclothes, sheets and blankets)?: Total Difficulty moving from lying on back to sitting on the side of the bed? : Total Difficulty sitting down on and standing up from a chair with arms (e.g., wheelchair, bedside commode, etc,.)?: Total Help needed moving to and from a bed to chair (including a wheelchair)?: A Little Help needed walking in hospital room?: A Little Help needed climbing 3-5 steps with a railing? : A Little 6 Click Score: 12    End of Session Equipment Utilized During Treatment: Gait belt Activity Tolerance: Patient limited by pain;Other (comment) (nausea) Patient left: in bed;with call bell/phone within reach Nurse Communication: Mobility status PT Visit Diagnosis: Difficulty in walking, not elsewhere classified (R26.2)    Time: 0981-19141035-1113 PT Time Calculation (min) (ACUTE ONLY): 38 min   Charges:   PT Evaluation $PT Eval Low Complexity: 1 Low PT Treatments $Gait Training: 8-22 mins $Therapeutic Exercise: 8-22 mins   PT G Codes:        Pg [phone]   Jyren Cerasoli 08/31/2016, 12:46 PM

## 2016-09-01 LAB — CBC
HCT: 36.7 % — ABNORMAL LOW (ref 39.0–52.0)
HEMOGLOBIN: 12.7 g/dL — AB (ref 13.0–17.0)
MCH: 30.2 pg (ref 26.0–34.0)
MCHC: 34.6 g/dL (ref 30.0–36.0)
MCV: 87.4 fL (ref 78.0–100.0)
PLATELETS: 112 10*3/uL — AB (ref 150–400)
RBC: 4.2 MIL/uL — ABNORMAL LOW (ref 4.22–5.81)
RDW: 13.5 % (ref 11.5–15.5)
WBC: 8.9 10*3/uL (ref 4.0–10.5)

## 2016-09-01 NOTE — Progress Notes (Signed)
Discharge  Instructions reviewed with patient utilizing  Teach back method no questions at this time. Patient discharge to dischrged

## 2016-09-01 NOTE — Progress Notes (Signed)
Occupational Therapy Treatment Patient Details Name: Benjamin BorneKenneth Lynn MRN: [id number] DOB: [birth year] Today's Date: 09/01/2016    History of present illness Pt s/p R TKR    OT comments  Educated on shower transfer and safe use of DME. Pt supposed to d/c today. Did well during session. Educated on safety with walker and sitting down for initial showers. Family present for session.   Follow Up Recommendations  Supervision/Assistance - 24 hour    Equipment Recommendations  None recommended by OT    Recommendations for Other Services      Precautions / Restrictions Precautions Precautions: Knee;Fall Restrictions Weight Bearing Restrictions: No Other Position/Activity Restrictions: WBAT       Mobility Bed Mobility      General bed mobility comments: in chair.  Transfers Overall transfer level: Needs assistance Equipment used: Rolling walker (2 wheeled) Transfers: Sit to/from Stand Sit to Stand: Min guard         General transfer comment: cues for hand placement    Balance                                           ADL either performed or assessed with clinical judgement   ADL                                   Tub/ Shower Transfer: Walk-in shower;Min guard;Rolling walker     General ADL Comments: Practiced shower transfer. Pt states he has a 3in1 at home. Educated pt that he can use the 3in1 as a shower chair and pt reports he will use this as a shower chair. Pt's daughter and grandson present for education. Pt educated on proper technique for stepping in and out of the shower and did very well. Emphasized need for family member to hold walker steady as pt steps in and out of the shower. Reviewed sequence of which LE to step into and out of shower first. Discussed placement of 3in1 in shower for safety and he does have a HH shower also.      Vision Patient Visual Report: No change from baseline     Perception     Praxis       Cognition Arousal/Alertness: Awake/alert Behavior During Therapy: WFL for tasks assessed/performed Overall Cognitive Status: Within Functional Limits for tasks assessed                                          Exercises     Shoulder Instructions       General Comments      Pertinent Vitals/ Pain       Pain Assessment: 0-10 Pain Score: 5  Pain Location: R knee Pain Descriptors / Indicators: Sore;Operative site guarding Pain Intervention(s): Monitored during session;Premedicated before session;Repositioned;Ice applied  Home Living                                          Prior Functioning/Environment              Frequency  Min 2X/week        Progress Toward Goals  OT Goals(current goals  can now be found in the care plan section)  Progress towards OT goals: Progressing toward goals     Plan      Co-evaluation                 AM-PAC PT "6 Clicks" Daily Activity     Outcome Measure   Help from another person eating meals?: None Help from another person taking care of personal grooming?: None Help from another person toileting, which includes using toliet, bedpan, or urinal?: A Little Help from another person bathing (including washing, rinsing, drying)?: A Little Help from another person to put on and taking off regular upper body clothing?: A Little Help from another person to put on and taking off regular lower body clothing?: A Lot 6 Click Score: 19    End of Session Equipment Utilized During Treatment: Rolling walker  OT Visit Diagnosis: Pain Pain - Right/Left: Right Pain - part of body: Knee   Activity Tolerance Patient tolerated treatment well   Patient Left in bed;with call bell/phone within reach;with family/visitor present   Nurse Communication          Time: 1610-96041020-1043 OT Time Calculation (min): 23 min  Charges: OT General Charges $OT Visit: 1 Procedure OT Treatments $Therapeutic  Activity: 23-37 mins     Zannie KehrStephanie S Medora Roorda 09/01/2016, 12:46 PM

## 2016-09-01 NOTE — Progress Notes (Signed)
     Subjective: 2 Days Post-Op Procedure(s) (LRB): RIGHT TOTAL KNEE ARTHROPLASTY (Right)   Patient reports pain as mild, pain controlled. No events throughout the night. Ready to work with PT.  Ready to be discharged home.  Objective:   VITALS:   Vitals:   08/31/16 2005 09/01/16 0453  BP: 131/65 123/65  Pulse: 91 81  Resp: 17 17  Temp: 97.6 F (36.4 C) 98.3 F (36.8 C)   ACE bandage removed, felt pressure from the ACE Dorsiflexion/Plantar flexion intact Incision: scant drainage No cellulitis present Compartment soft  LABS  Recent Labs  08/31/16 0522 09/01/16 0541  HGB 14.4 12.7*  HCT 41.8 36.7*  WBC 15.0* 8.9  PLT 143* 112*     Recent Labs  08/31/16 0522  NA 137  K 4.1  BUN 17  CREATININE 0.62  GLUCOSE 170*     Assessment/Plan: 2 Days Post-Op Procedure(s) (LRB): RIGHT TOTAL KNEE ARTHROPLASTY (Right) Up with therapy Discharge home [facility] Follow up with Dr. Thomasena Edisollins in 2 weeks.  Contact information:  [facility] [address], [address] [place] [id number] [phone]        Anastasio AuerbachMatthew S. Leannah Guse   PAC  09/01/2016, 8:40 AM

## 2016-09-01 NOTE — Progress Notes (Signed)
Physical Therapy Treatment Patient Details Name: Alleen BorneKenneth Saldarriaga MRN: [id number] DOB: 12-06-1955 Today's Date: 09/01/2016    History of Present Illness Pt s/p R TKR     PT Comments    POD # 2 am session Assisted OOB to amb a great distance.  Practiced 4 steps using one left rail and one right crutch.  Pt stated he does have crutches at home.  Pt c/o max fatigue.  Will need a secone session to address TE's.    Follow Up Recommendations  Home health PT     Equipment Recommendations  Rolling walker with 5" wheels    Recommendations for Other Services       Precautions / Restrictions Precautions Precautions: Knee;Fall Restrictions Weight Bearing Restrictions: No Other Position/Activity Restrictions: WBAT    Mobility  Bed Mobility Overal bed mobility: Needs Assistance Bed Mobility: Supine to Sit     Supine to sit: Min guard     General bed mobility comments: increased time  Transfers Overall transfer level: Needs assistance Equipment used: Rolling walker (2 wheeled) Transfers: Sit to/from Stand Sit to Stand: Min guard         General transfer comment: cues for UE and LE placement  Ambulation/Gait Ambulation/Gait assistance: Min guard Ambulation Distance (Feet): 145 Feet Assistive device: Rolling walker (2 wheeled)   Gait velocity: decr   General Gait Details: cues for sequence, posture and position from RW   Stairs Stairs: Yes   Stair Management: One rail Left;Step to pattern;Forwards;With crutches Number of Stairs: 4 General stair comments: using L rail and one crutch and 25% VC's on proper tech and safety  Wheelchair Mobility    Modified Rankin (Stroke Patients Only)       Balance                                            Cognition Arousal/Alertness: Awake/alert Behavior During Therapy: WFL for tasks assessed/performed Overall Cognitive Status: Within Functional Limits for tasks assessed                                        Exercises      General Comments        Pertinent Vitals/Pain Pain Assessment: 0-10 Pain Score: 5  Pain Location: R knee Pain Descriptors / Indicators: Sore;Operative site guarding Pain Intervention(s): Monitored during session;Premedicated before session;Repositioned;Ice applied    Home Living                      Prior Function            PT Goals (current goals can now be found in the care plan section) Progress towards PT goals: Progressing toward goals    Frequency    7X/week      PT Plan Current plan remains appropriate    Co-evaluation              AM-PAC PT "6 Clicks" Daily Activity  Outcome Measure  Difficulty turning over in bed (including adjusting bedclothes, sheets and blankets)?: A Lot Difficulty moving from lying on back to sitting on the side of the bed? : A Lot Difficulty sitting down on and standing up from a chair with arms (e.g., wheelchair, bedside commode, etc,.)?: A Lot Help needed moving to and from  a bed to chair (including a wheelchair)?: A Lot Help needed walking in hospital room?: A Lot Help needed climbing 3-5 steps with a railing? : A Lot 6 Click Score: 12    End of Session Equipment Utilized During Treatment: Gait belt Activity Tolerance: Patient tolerated treatment well;Patient limited by pain Patient left: in bed;with call bell/phone within reach Nurse Communication: Mobility status PT Visit Diagnosis: Difficulty in walking, not elsewhere classified (R26.2)     Time: [id number] PT Time Calculation (min) (ACUTE ONLY): 25 min  Charges:  $Gait Training: 8-22 mins $Therapeutic Activity: 8-22 mins                    G Codes:       Felecia ShellingLori Jorje Vanatta  PTA [facility]  Acute  Rehab Pager      [phone]

## 2016-09-01 NOTE — Progress Notes (Signed)
Physical Therapy Treatment Patient Details Name: Benjamin BorneKenneth Lynn MRN: [id number] DOB: March 21, 1955 Today's Date: 09/01/2016    History of Present Illness Pt s/p R TKR     PT Comments    POD # 2 Returned to perform TKR TE's following handout HEP.  Instructed on proper tech, freq as well as use od ICE.   Follow Up Recommendations  Home health PT     Equipment Recommendations  Rolling walker with 5" wheels    Recommendations for Other Services       Precautions / Restrictions Precautions Precautions: Knee;Fall Restrictions Weight Bearing Restrictions: No Other Position/Activity Restrictions: WBAT       Balance                                            Cognition Arousal/Alertness: Awake/alert Behavior During Therapy: WFL for tasks assessed/performed Overall Cognitive Status: Within Functional Limits for tasks assessed                                        Exercises   Total Knee Replacement TE's 10 reps B LE ankle pumps 10 reps towel squeezes 10 reps knee presses 10 reps heel slides  10 reps SAQ's 10 reps SLR's 10 reps ABD Followed by ICE     General Comments        Pertinent Vitals/Pain Pain Assessment: 0-10 Pain Score: 5  Pain Location: R knee Pain Descriptors / Indicators: Sore;Operative site guarding Pain Intervention(s): Monitored during session;Premedicated before session;Repositioned;Ice applied    Home Living                      Prior Function            PT Goals (current goals can now be found in the care plan section) Progress towards PT goals: Progressing toward goals    Frequency    7X/week      PT Plan Current plan remains appropriate    Co-evaluation              AM-PAC PT "6 Clicks" Daily Activity  Outcome Measure  Difficulty turning over in bed (including adjusting bedclothes, sheets and blankets)?: A Lot Difficulty moving from lying on back to sitting on the side of  the bed? : A Lot Difficulty sitting down on and standing up from a chair with arms (e.g., wheelchair, bedside commode, etc,.)?: A Lot Help needed moving to and from a bed to chair (including a wheelchair)?: A Lot Help needed walking in hospital room?: A Lot Help needed climbing 3-5 steps with a railing? : A Lot 6 Click Score: 12    End of Session Equipment Utilized During Treatment: Gait belt Activity Tolerance: Patient tolerated treatment well;Patient limited by pain Patient left: in bed;with call bell/phone within reach Nurse Communication: Mobility status PT Visit Diagnosis: Difficulty in walking, not elsewhere classified (R26.2)     Time: [id number] PT Time Calculation (min) (ACUTE ONLY): 12 min  Charges:   $Therapeutic Exercise: 8-22 mins                     G Codes:       Benjamin ShellingLori Demetra Lynn  PTA [facility]  Acute  Rehab Pager      [phone]

## 2016-09-01 NOTE — Discharge Instructions (Signed)

## 2016-09-22 NOTE — Discharge Summary (Signed)
Physician Discharge Summary  Patient ID: Benjamin Lynn MRN: [id number] DOB/AGE: January 13, 1956 61 y.o.  Admit date: 08/30/2016 Discharge date: 09/01/16 Admission Diagnoses:right knee oa  Discharge Diagnoses:  Active Problems:   S/P TKR (total knee replacement) using cement, right   Primary osteoarthritis of right knee   S/P knee replacement   Discharged Condition: good  Hospital Course:  Benjamin Lynn is a 61 y.o. who was admitted to [facility] They were brought to the operating room on 08/30/2016 and underwent Procedure(s): RIGHT TOTAL KNEE ARTHROPLASTY.  Patient tolerated the procedure well and was later transferred to the recovery room and then to the orthopaedic floor for postoperative care.  They were given PO and IV analgesics for pain control following their surgery.  They were given 24 hours of postoperative antibiotics of  Anti-infectives    Start     Dose/Rate Route Frequency Ordered Stop   08/30/16 2030  ceFAZolin (ANCEF) IVPB 2g/100 mL premix     2 g 200 mL/hr over 30 Minutes Intravenous Every 6 hours 08/30/16 1806 08/31/16 0215   08/30/16 1111  ceFAZolin (ANCEF) IVPB 2g/100 mL premix     2 g 200 mL/hr over 30 Minutes Intravenous On call to O.R. 08/30/16 1111 08/30/16 1445     and started on DVT prophylaxis in the form of Lovenox.   PT and OT were ordered for total joint protocol.  Discharge planning consulted to help with postop disposition and equipment needs.  Patient had a good night on the evening of surgery and started to get up OOB with therapy on day one.  Hemovac drain was pulled without difficulty.  Continued to work with therapy into day two.  Dressing was with normal limits.  The patient had progressed with therapy and meeting their goals. Patient was seen in rounds and was ready to go home.  Consults: n/a  Significant Diagnostic Studies: routine  Treatments: routine  Discharge Exam: Blood pressure 123/65, pulse 81, temperature 98.3 F (36.8 C),  temperature source Oral, resp. rate 17, height 5\' 8"  (1.727 m), weight 112 kg (247 lb), SpO2 95 %. Well nourished. Alert and oriented x3. RRR, Lungs clear, BS x4. Abdomen soft and non tender. Right Calf soft and non tender. Right knee dressing C/D/I. No DVT signs. Compartment soft. No signs of infection.  Right LE grossly neurovascular intact. Disposition: 01-Home or Self Care  Discharge Instructions    Call MD / Call 911    Complete by:  As directed    If you experience chest pain or shortness of breath, CALL 911 and be transported to the hospital emergency room.  If you develope a fever above 101 F, pus (white drainage) or increased drainage or redness at the wound, or calf pain, call your surgeon's office.   Constipation Prevention    Complete by:  As directed    Drink plenty of fluids.  Prune juice may be helpful.  You may use a stool softener, such as Colace (over the counter) 100 mg twice a day.  Use MiraLax (over the counter) for constipation as needed.   Diet - low sodium heart healthy    Complete by:  As directed    Discharge instructions    Complete by:  As directed    INSTRUCTIONS AFTER JOINT REPLACEMENT   Remove items at home which could result in a fall. This includes throw rugs or furniture in walking pathways ICE to the affected joint every three hours while awake for 30 minutes at a  time, for at least the first 3-5 days, and then as needed for pain and swelling.  Continue to use ice for pain and swelling. You may notice swelling that will progress down to the foot and ankle.  This is normal after surgery.  Elevate your leg when you are not up walking on it.   Continue to use the breathing machine you got in the hospital (incentive spirometer) which will help keep your temperature down.  It is common for your temperature to cycle up and down following surgery, especially at night when you are not up moving around and exerting yourself.  The breathing machine keeps your lungs  expanded and your temperature down.   DIET:  As you were doing prior to hospitalization, we recommend a well-balanced diet.  DRESSING / WOUND CARE / SHOWERING  Keep the surgical dressing until follow up.  The dressing is water proof, so you can shower without any extra covering.  IF THE DRESSING FALLS OFF or the wound gets wet inside, change the dressing with sterile gauze.  Please use good hand washing techniques before changing the dressing.  Do not use any lotions or creams on the incision until instructed by your surgeon.    ACTIVITY  Increase activity slowly as tolerated, but follow the weight bearing instructions below.   No driving for 6 weeks or until further direction given by your physician.  You cannot drive while taking narcotics.  No lifting or carrying greater than 10 lbs. until further directed by your surgeon. Avoid periods of inactivity such as sitting longer than an hour when not asleep. This helps prevent blood clots.  You may return to work once you are authorized by your doctor.     WEIGHT BEARING   Weight bearing as tolerated with assist device (walker, cane, etc) as directed, use it as long as suggested by your surgeon or therapist, typically at least 4-6 weeks.   EXERCISES  Results after joint replacement surgery are often greatly improved when you follow the exercise, range of motion and muscle strengthening exercises prescribed by your doctor. Safety measures are also important to protect the joint from further injury. Any time any of these exercises cause you to have increased pain or swelling, decrease what you are doing until you are comfortable again and then slowly increase them. If you have problems or questions, call your caregiver or physical therapist for advice.   Rehabilitation is important following a joint replacement. After just a few days of immobilization, the muscles of the leg can become weakened and shrink (atrophy).  These exercises are  designed to build up the tone and strength of the thigh and leg muscles and to improve motion. Often times heat used for twenty to thirty minutes before working out will loosen up your tissues and help with improving the range of motion but do not use heat for the first two weeks following surgery (sometimes heat can increase post-operative swelling).   These exercises can be done on a training (exercise) mat, on the floor, on a table or on a bed. Use whatever works the best and is most comfortable for you.    Use music or television while you are exercising so that the exercises are a pleasant break in your day. This will make your life better with the exercises acting as a break in your routine that you can look forward to.   Perform all exercises about fifteen times, three times per day or as directed.  You should exercise both the operative leg and the other leg as well.   Exercises include:   Quad Sets - Tighten up the muscle on the front of the thigh (Quad) and hold for 5-10 seconds.   Straight Leg Raises - With your knee straight (if you were given a brace, keep it on), lift the leg to 60 degrees, hold for 3 seconds, and slowly lower the leg.  Perform this exercise against resistance later as your leg gets stronger.  Leg Slides: Lying on your back, slowly slide your foot toward your buttocks, bending your knee up off the floor (only go as far as is comfortable). Then slowly slide your foot back down until your leg is flat on the floor again.  Angel Wings: Lying on your back spread your legs to the side as far apart as you can without causing discomfort.  Hamstring Strength:  Lying on your back, push your heel against the floor with your leg straight by tightening up the muscles of your buttocks.  Repeat, but this time bend your knee to a comfortable angle, and push your heel against the floor.  You may put a pillow under the heel to make it more comfortable if necessary.   A rehabilitation program  following joint replacement surgery can speed recovery and prevent re-injury in the future due to weakened muscles. Contact your doctor or a physical therapist for more information on knee rehabilitation.    CONSTIPATION  Constipation is defined medically as fewer than three stools per week and severe constipation as less than one stool per week.  Even if you have a regular bowel pattern at home, your normal regimen is likely to be disrupted due to multiple reasons following surgery.  Combination of anesthesia, postoperative narcotics, change in appetite and fluid intake all can affect your bowels.   YOU MUST use at least one of the following options; they are listed in order of increasing strength to get the job done.  They are all available over the counter, and you may need to use some, POSSIBLY even all of these options:    Drink plenty of fluids (prune juice may be helpful) and high fiber foods Colace 100 mg by mouth twice a day  Senokot for constipation as directed and as needed Dulcolax (bisacodyl), take with full glass of water  Miralax (polyethylene glycol) once or twice a day as needed.  If you have tried all these things and are unable to have a bowel movement in the first 3-4 days after surgery call either your surgeon or your primary doctor.    If you experience loose stools or diarrhea, hold the medications until you stool forms back up.  If your symptoms do not get better within 1 week or if they get worse, check with your doctor.  If you experience "the worst abdominal pain ever" or develop nausea or vomiting, please contact the office immediately for further recommendations for treatment.   ITCHING:  If you experience itching with your medications, try taking only a single pain pill, or even half a pain pill at a time.  You can also use Benadryl over the counter for itching or also to help with sleep.   TED HOSE STOCKINGS:  Use stockings on both legs until for at least 2 weeks  or as directed by physician office. They may be removed at night for sleeping.  MEDICATIONS:  See your medication summary on the "After Visit Summary" that nursing will review with  you.  You may have some home medications which will be placed on hold until you complete the course of blood thinner medication.  It is important for you to complete the blood thinner medication as prescribed.  PRECAUTIONS:  If you experience chest pain or shortness of breath - call 911 immediately for transfer to the hospital emergency department.   If you develop a fever greater that 101 F, purulent drainage from wound, increased redness or drainage from wound, foul odor from the wound/dressing, or calf pain - CONTACT YOUR SURGEON.                                                   FOLLOW-UP APPOINTMENTS:  If you do not already have a post-op appointment, please call the office for an appointment to be seen by your surgeon.  Guidelines for how soon to be seen are listed in your "After Visit Summary", but are typically between 1-4 weeks after surgery.  OTHER INSTRUCTIONS:   Knee Replacement:  Do not place pillow under knee, focus on keeping the knee straight while resting. CPM instructions: 0-90 degrees, 2 hours in the morning, 2 hours in the afternoon, and 2 hours in the evening. Place foam block, curve side up under heel at all times except when in CPM or when walking.  DO NOT modify, tear, cut, or change the foam block in any way.  MAKE SURE YOU:  Understand these instructions.  Get help right away if you are not doing well or get worse.    Thank you for letting us be a part of your medical care team.  It is a privilege we respect greatly.  We hope these instructions will help you stay on track for a fast and full recovery!   Increase activity slowly as tolerated    Complete by:  As directed      Allergies as of 09/01/2016   No Known Allergies     Medication List    STOP taking these medications   DUEXIS  800-26.6 MG Tabs Generic drug:  Ibuprofen-Famotidine     TAKE these medications   aspirin EC 325 MG tablet Take 1 tablet (325 mg total) by mouth 2 (two) times daily.   Fish Oil 1200 MG Caps Take 2 capsules by mouth daily.   HYDROcodone-acetaminophen 5-325 MG tablet Commonly known as:  NORCO/VICODIN Take 1-2 tablets by mouth every 4 (four) hours as needed for moderate pain.   methocarbamol 500 MG tablet Commonly known as:  ROBAXIN Take 1 tablet (500 mg total) by mouth every 6 (six) hours as needed for muscle spasms. What changed:  medication strength  how much to take  when to take this  reasons to take this   phentermine 37.5 MG capsule Take 37.5 mg by mouth every morning.   traZODone 50 MG tablet Commonly known as:  DESYREL Take 50 mg by mouth at bedtime.   vitamin C 1000 MG tablet Take 1,000 mg by mouth daily.     ASK your doctor about these medications   oxyCODONE 5 MG immediate release tablet Commonly known as:  ROXICODONE Take 1 tablet (5 mg total) by mouth every 4 (four) hours as needed. Ask about: Should I take this medication?            Discharge Care Instructions  Start     Ordered   08/31/16 0000  HYDROcodone-acetaminophen (NORCO/VICODIN) 5-325 MG tablet  Every 4 hours PRN     08/31/16 0750   08/30/16 0000  methocarbamol (ROBAXIN) 500 MG tablet  Every 6 hours PRN     08/30/16 1654   08/30/16 0000  Call MD / Call 911    Comments:  If you experience chest pain or shortness of breath, CALL 911 and be transported to the hospital emergency room.  If you develope a fever above 101 F, pus (white drainage) or increased drainage or redness at the wound, or calf pain, call your surgeon's office.   08/30/16 1654   08/30/16 0000  Diet - low sodium heart healthy     08/30/16 1654   08/30/16 0000  Constipation Prevention    Comments:  Drink plenty of fluids.  Prune juice may be helpful.  You may use a stool softener, such as Colace (over the  counter) 100 mg twice a day.  Use MiraLax (over the counter) for constipation as needed.   08/30/16 1654   08/30/16 0000  Increase activity slowly as tolerated     08/30/16 1654   08/30/16 0000  Discharge instructions    Comments:  INSTRUCTIONS AFTER JOINT REPLACEMENT   Remove items at home which could result in a fall. This includes throw rugs or furniture in walking pathways ICE to the affected joint every three hours while awake for 30 minutes at a time, for at least the first 3-5 days, and then as needed for pain and swelling.  Continue to use ice for pain and swelling. You may notice swelling that will progress down to the foot and ankle.  This is normal after surgery.  Elevate your leg when you are not up walking on it.   Continue to use the breathing machine you got in the hospital (incentive spirometer) which will help keep your temperature down.  It is common for your temperature to cycle up and down following surgery, especially at night when you are not up moving around and exerting yourself.  The breathing machine keeps your lungs expanded and your temperature down.   DIET:  As you were doing prior to hospitalization, we recommend a well-balanced diet.  DRESSING / WOUND CARE / SHOWERING  Keep the surgical dressing until follow up.  The dressing is water proof, so you can shower without any extra covering.  IF THE DRESSING FALLS OFF or the wound gets wet inside, change the dressing with sterile gauze.  Please use good hand washing techniques before changing the dressing.  Do not use any lotions or creams on the incision until instructed by your surgeon.    ACTIVITY  Increase activity slowly as tolerated, but follow the weight bearing instructions below.   No driving for 6 weeks or until further direction given by your physician.  You cannot drive while taking narcotics.  No lifting or carrying greater than 10 lbs. until further directed by your surgeon. Avoid periods of inactivity  such as sitting longer than an hour when not asleep. This helps prevent blood clots.  You may return to work once you are authorized by your doctor.     WEIGHT BEARING   Weight bearing as tolerated with assist device (walker, cane, etc) as directed, use it as long as suggested by your surgeon or therapist, typically at least 4-6 weeks.   EXERCISES  Results after joint replacement surgery are often greatly improved when you follow the  exercise, range of motion and muscle strengthening exercises prescribed by your doctor. Safety measures are also important to protect the joint from further injury. Any time any of these exercises cause you to have increased pain or swelling, decrease what you are doing until you are comfortable again and then slowly increase them. If you have problems or questions, call your caregiver or physical therapist for advice.   Rehabilitation is important following a joint replacement. After just a few days of immobilization, the muscles of the leg can become weakened and shrink (atrophy).  These exercises are designed to build up the tone and strength of the thigh and leg muscles and to improve motion. Often times heat used for twenty to thirty minutes before working out will loosen up your tissues and help with improving the range of motion but do not use heat for the first two weeks following surgery (sometimes heat can increase post-operative swelling).   These exercises can be done on a training (exercise) mat, on the floor, on a table or on a bed. Use whatever works the best and is most comfortable for you.    Use music or television while you are exercising so that the exercises are a pleasant break in your day. This will make your life better with the exercises acting as a break in your routine that you can look forward to.   Perform all exercises about fifteen times, three times per day or as directed.  You should exercise both the operative leg and the other leg as  well.   Exercises include:   Quad Sets - Tighten up the muscle on the front of the thigh (Quad) and hold for 5-10 seconds.   Straight Leg Raises - With your knee straight (if you were given a brace, keep it on), lift the leg to 60 degrees, hold for 3 seconds, and slowly lower the leg.  Perform this exercise against resistance later as your leg gets stronger.  Leg Slides: Lying on your back, slowly slide your foot toward your buttocks, bending your knee up off the floor (only go as far as is comfortable). Then slowly slide your foot back down until your leg is flat on the floor again.  Angel Wings: Lying on your back spread your legs to the side as far apart as you can without causing discomfort.  Hamstring Strength:  Lying on your back, push your heel against the floor with your leg straight by tightening up the muscles of your buttocks.  Repeat, but this time bend your knee to a comfortable angle, and push your heel against the floor.  You may put a pillow under the heel to make it more comfortable if necessary.   A rehabilitation program following joint replacement surgery can speed recovery and prevent re-injury in the future due to weakened muscles. Contact your doctor or a physical therapist for more information on knee rehabilitation.    CONSTIPATION  Constipation is defined medically as fewer than three stools per week and severe constipation as less than one stool per week.  Even if you have a regular bowel pattern at home, your normal regimen is likely to be disrupted due to multiple reasons following surgery.  Combination of anesthesia, postoperative narcotics, change in appetite and fluid intake all can affect your bowels.   YOU MUST use at least one of the following options; they are listed in order of increasing strength to get the job done.  They are all available over the counter,  and you may need to use some, POSSIBLY even all of these options:    Drink plenty of fluids (prune  juice may be helpful) and high fiber foods Colace 100 mg by mouth twice a day  Senokot for constipation as directed and as needed Dulcolax (bisacodyl), take with full glass of water  Miralax (polyethylene glycol) once or twice a day as needed.  If you have tried all these things and are unable to have a bowel movement in the first 3-4 days after surgery call either your surgeon or your primary doctor.    If you experience loose stools or diarrhea, hold the medications until you stool forms back up.  If your symptoms do not get better within 1 week or if they get worse, check with your doctor.  If you experience "the worst abdominal pain ever" or develop nausea or vomiting, please contact the office immediately for further recommendations for treatment.   ITCHING:  If you experience itching with your medications, try taking only a single pain pill, or even half a pain pill at a time.  You can also use Benadryl over the counter for itching or also to help with sleep.   TED HOSE STOCKINGS:  Use stockings on both legs until for at least 2 weeks or as directed by physician office. They may be removed at night for sleeping.  MEDICATIONS:  See your medication summary on the "After Visit Summary" that nursing will review with you.  You may have some home medications which will be placed on hold until you complete the course of blood thinner medication.  It is important for you to complete the blood thinner medication as prescribed.  PRECAUTIONS:  If you experience chest pain or shortness of breath - call 911 immediately for transfer to the hospital emergency department.   If you develop a fever greater that 101 F, purulent drainage from wound, increased redness or drainage from wound, foul odor from the wound/dressing, or calf pain - CONTACT YOUR SURGEON.                                                   FOLLOW-UP APPOINTMENTS:  If you do not already have a post-op appointment, please call the office for  an appointment to be seen by your surgeon.  Guidelines for how soon to be seen are listed in your "After Visit Summary", but are typically between 1-4 weeks after surgery.  OTHER INSTRUCTIONS:   Knee Replacement:  Do not place pillow under knee, focus on keeping the knee straight while resting. CPM instructions: 0-90 degrees, 2 hours in the morning, 2 hours in the afternoon, and 2 hours in the evening. Place foam block, curve side up under heel at all times except when in CPM or when walking.  DO NOT modify, tear, cut, or change the foam block in any way.  MAKE SURE YOU:  Understand these instructions.  Get help right away if you are not doing well or get worse.    Thank you for letting us be a part of your medical care team.  It is a privilege we respect greatly.  We hope these instructions will help you stay on track for a fast and full recovery!   08/30/16 1654   08/30/16 0000  oxyCODONE (ROXICODONE) 5 MG immediate release tablet  Every 4  hours PRN     08/30/16 1654   08/30/16 0000  aspirin EC 325 MG tablet  2 times daily     08/30/16 1654     Follow-up Information    [facility] Follow up.   Specialty:  Home Health Services Why:  Physical therapy Contact information: [address] [place] [address] [phone]        Eugenia Mcalpine, MD. Schedule an appointment as soon as possible for a visit in 2 week(s).   Specialty:  Orthopedic Surgery Contact information: [address] [place] [address] [phone]           Signed: Markham Jordan 09/22/2016, 6:23 PM

## 2018-10-10 ENCOUNTER — Other Ambulatory Visit: Payer: Self-pay

## 2018-10-10 ENCOUNTER — Emergency Department (HOSPITAL_COMMUNITY): Payer: BC Managed Care – PPO

## 2018-10-10 ENCOUNTER — Emergency Department (HOSPITAL_COMMUNITY)
Admission: EM | Admit: 2018-10-10 | Discharge: 2018-10-11 | Disposition: A | Discharge status: Home / Self Care | Payer: BC Managed Care – PPO | Attending: Emergency Medicine | Admitting: Emergency Medicine

## 2018-10-10 ENCOUNTER — Encounter (HOSPITAL_COMMUNITY): Payer: Self-pay | Admitting: Emergency Medicine

## 2018-10-10 DIAGNOSIS — Y929 Unspecified place or not applicable: Secondary | ICD-10-CM | POA: Diagnosis not present

## 2018-10-10 DIAGNOSIS — S8002XA Contusion of left knee, initial encounter: Secondary | ICD-10-CM | POA: Diagnosis not present

## 2018-10-10 DIAGNOSIS — Y939 Activity, unspecified: Secondary | ICD-10-CM | POA: Insufficient documentation

## 2018-10-10 DIAGNOSIS — W010XXA Fall on same level from slipping, tripping and stumbling without subsequent striking against object, initial encounter: Secondary | ICD-10-CM | POA: Insufficient documentation

## 2018-10-10 DIAGNOSIS — Z79899 Other long term (current) drug therapy: Secondary | ICD-10-CM | POA: Diagnosis not present

## 2018-10-10 DIAGNOSIS — S8001XA Contusion of right knee, initial encounter: Secondary | ICD-10-CM | POA: Insufficient documentation

## 2018-10-10 DIAGNOSIS — S0990XA Unspecified injury of head, initial encounter: Secondary | ICD-10-CM | POA: Diagnosis present

## 2018-10-10 DIAGNOSIS — S0083XA Contusion of other part of head, initial encounter: Secondary | ICD-10-CM | POA: Diagnosis not present

## 2018-10-10 DIAGNOSIS — Z96651 Presence of right artificial knee joint: Secondary | ICD-10-CM | POA: Diagnosis not present

## 2018-10-10 DIAGNOSIS — W19XXXA Unspecified fall, initial encounter: Secondary | ICD-10-CM

## 2018-10-10 DIAGNOSIS — S42142A Displaced fracture of glenoid cavity of scapula, left shoulder, initial encounter for closed fracture: Secondary | ICD-10-CM | POA: Diagnosis not present

## 2018-10-10 DIAGNOSIS — Z87891 Personal history of nicotine dependence: Secondary | ICD-10-CM | POA: Diagnosis not present

## 2018-10-10 DIAGNOSIS — Y999 Unspecified external cause status: Secondary | ICD-10-CM | POA: Insufficient documentation

## 2018-10-10 NOTE — ED Triage Notes (Signed)
Pt tripped over boots and fell in the gravel.  C/o lt shoulder pain and bilateral knee pain. Pt has knot on forehead.

## 2018-10-11 ENCOUNTER — Emergency Department (HOSPITAL_COMMUNITY): Payer: BC Managed Care – PPO

## 2018-10-11 MED ORDER — OXYCODONE-ACETAMINOPHEN 5-325 MG PO TABS: 1.0000 | ORAL_TABLET | ORAL | 0 refills | Status: AC | PRN

## 2018-10-11 MED ORDER — OXYCODONE-ACETAMINOPHEN 5-325 MG PO TABS
1.0000 | ORAL_TABLET | Freq: Once | ORAL | Status: AC
  Administered 2018-10-11: 01:00:00 1 via ORAL
  Filled 2018-10-11: qty 1

## 2018-10-11 NOTE — Discharge Instructions (Addendum)
Use ice packs over the painful areas. Wear the sling for comfort. Take the percocet for pain. Please call [facility]) on Monday September 14, to have them evaluate your fracture (displaced inferior glenoid fracture). Return to the ED for any problems on the head injury sheet.

## 2018-10-11 NOTE — ED Notes (Signed)
Pt gone to CT 

## 2018-10-11 NOTE — ED Provider Notes (Signed)
[facility] EMERGENCY DEPARTMENT Provider Note   CSN: [id number] Arrival date & time: 10/10/18  1651   Time seen 12:20 AM  History   Chief Complaint Chief Complaint  Patient presents with   Fall    HPI Benjamin Lynn is a 63 y.o. male.     HPI patient states about 3:30 PM he was outside in his car started rolling.  He started running towards the car and tried to open the door to jump been however the door knocked him down onto his left side.  He has some bruising on his left forehead but denies loss of consciousness.  He states there was no when outside and he had to crawl to the house to grab something to help pull himself up.  Patient is right-handed.  He complains of worst pain in his left shoulder, he denies headache but states he is having new neck pain mainly on the left side.  He states his left shoulder feels like he has a TENS unit inside of it going off.  He denies any new numbness in his fingers.  He denies nausea, vomiting, or visual changes.  He also complains of some pain in his knees, he has some pain in his left posterior knee when he straightens it.  Patient is also already followed at [facility] and states he needs a left knee replacement, followed by [name] and a right hip replacement followed by [name].  He has had a prior left shoulder injury and had a rotator cuff tear.  Patient states he had 5 teeth removed recently and was on hydrocodone, reviewing the [facility] he had received 12 pills.  He states currently he is taken ibuprofen.  PCP [name], MD   Past Medical History:  Diagnosis Date   Arthritis    History of kidney stones     Patient Active Problem List   Diagnosis Date Noted   S/P TKR (total knee replacement) using cement, right 08/30/2016   Primary osteoarthritis of right knee 08/30/2016   S/P knee replacement 08/30/2016    Past Surgical History:  Procedure Laterality Date   HERNIA REPAIR      SHOULDER SURGERY     TOTAL KNEE ARTHROPLASTY Right 08/30/2016   Procedure: RIGHT TOTAL KNEE ARTHROPLASTY;  Surgeon: Sydnee Cabal, MD;  Location: [facility];  Service: Orthopedics;  Laterality: Right;        Home Medications    Prior to Admission medications   Medication Sig Start Date End Date Taking? Authorizing Provider  Ascorbic Acid (VITAMIN C) 1000 MG tablet Take 1,000 mg by mouth daily.    [provider]  HYDROcodone-acetaminophen (NORCO/VICODIN) 5-325 MG tablet Take 1-2 tablets by mouth every 4 (four) hours as needed for moderate pain. 08/31/16   Stilwell, Bryson L, [name]  methocarbamol (ROBAXIN) 500 MG tablet Take 1 tablet (500 mg total) by mouth every 6 (six) hours as needed for muscle spasms. 08/30/16   Stilwell, Bryson L, [name]  Omega-3 Fatty Acids (FISH OIL) 1200 MG CAPS Take 2 capsules by mouth daily.    [provider]  oxyCODONE-acetaminophen (PERCOCET/ROXICET) 5-325 MG tablet Take 1 tablet by mouth every 4 (four) hours as needed for severe pain. 10/11/18   [name], MD  phentermine 37.5 MG capsule Take 37.5 mg by mouth every morning.    [provider]  traZODone (DESYREL) 50 MG tablet Take 50 mg by mouth at bedtime. 03/21/15   [provider]    Family  History Family History  Problem Relation Age of Onset   Heart attack Father     Social History Social History   Tobacco Use   Smoking status: Former Smoker    Quit date: 01/29/1993    Years since quitting: 25.7   Smokeless tobacco: Former NeurosurgeonUser  Substance Use Topics   Alcohol use: No   Drug use: No  lives at home   Allergies   Patient has no known allergies.   Review of Systems Review of Systems  All other systems reviewed and are negative.    Physical Exam Updated Vital Signs BP (!) 141/94    Pulse 72    Temp 98.6 F (37 C) (Oral)    Resp 16    Ht 5\' 8"  (1.727 m)    Wt 106.1 kg    SpO2 100%    BMI 35.58 kg/m   Physical Exam Vitals signs and nursing note  reviewed.  Constitutional:      General: He is not in acute distress.    Appearance: Normal appearance. He is well-developed. He is not ill-appearing or toxic-appearing.  HENT:     Head: Normocephalic.     Comments: Patient has a small cyst bruise about the size of a nickel in his left forehead and near the left medial eyebrow.  He has some mild tenderness in that area.    Right Ear: External ear normal.     Left Ear: External ear normal.     Nose: Nose normal. No mucosal edema or rhinorrhea.     Mouth/Throat:     Mouth: Mucous membranes are moist.     Dentition: No dental abscesses.     Pharynx: Oropharynx is clear. No uvula swelling.  Eyes:     Extraocular Movements: Extraocular movements intact.     Conjunctiva/sclera: Conjunctivae normal.     Pupils: Pupils are equal, round, and reactive to light.  Neck:     Musculoskeletal: Full passive range of motion without pain, normal range of motion and neck supple. Muscular tenderness present.     Comments: Patient has some tenderness along the left paraspinous muscles of his cervical spine, he appears to have intact range of motion of his neck. Cardiovascular:     Rate and Rhythm: Normal rate and regular rhythm.     Heart sounds: Normal heart sounds. No murmur. No friction rub. No gallop.   Pulmonary:     Effort: Pulmonary effort is normal. No respiratory distress.     Breath sounds: Normal breath sounds. No wheezing, rhonchi or rales.  Chest:     Chest wall: No tenderness or crepitus.  Abdominal:     General: There is no distension.  Musculoskeletal: Normal range of motion.        General: Tenderness present.     Comments: Moves all extremities well except his left upper extremity.  His left clavicle is nontender, he has some tenderness in the left shoulder joint.  He has some restricted range of motion due to pain.  He has good distal pulses and capillary refill and normal intact range of motion of his elbow and wrist.  He has some  faint bruising of the anterior knees bilaterally.  There is no obvious joint effusions appreciated.  He is status post right total knee replacement.  He has good range of motion of both knees and hips.  Skin:    General: Skin is warm and dry.     Capillary Refill: Capillary refill takes less than  2 seconds.     Coloration: Skin is not pale.     Findings: No erythema or rash.  Neurological:     General: No focal deficit present.     Mental Status: He is alert and oriented to person, place, and time.     Cranial Nerves: No cranial nerve deficit.  Psychiatric:        Mood and Affect: Mood normal. Mood is not anxious.        Speech: Speech normal.        Behavior: Behavior normal.        Thought Content: Thought content normal.      ED Treatments / Results  Labs (all labs ordered are listed, but only abnormal results are displayed) Labs Reviewed - No data to display  EKG None  Radiology Ct Head Wo Contrast  Ct Cervical Spine Wo Contrast  Result Date: 10/11/2018 CLINICAL DATA:  Fall with head trauma EXAM: CT HEAD WITHOUT CONTRAST CT CERVICAL SPINE WITHOUT CONTRAST TECHNIQUE: Multidetector CT imaging of the head and cervical spine was performed following the standard protocol without intravenous contrast. Multiplanar CT image reconstructions of the cervical spine were also generated. COMPARISON:  None. FINDINGS: CT HEAD FINDINGS Brain: There is no mass, hemorrhage or extra-axial collection. The size and configuration of the ventricles and extra-axial CSF spaces are normal. There is hypoattenuation of the periventricular white matter, most commonly indicating chronic ischemic microangiopathy. Vascular: No abnormal hyperdensity of the major intracranial arteries or dural venous sinuses. No intracranial atherosclerosis. Skull: The visualized skull base, calvarium and extracranial soft tissues are normal. Sinuses/Orbits: No fluid levels or advanced mucosal thickening of the visualized paranasal  sinuses. No mastoid or middle ear effusion. The orbits are normal. CT CERVICAL SPINE FINDINGS Alignment: No static subluxation. Facets are aligned. Occipital condyles are normally positioned. Skull base and vertebrae: No acute fracture. Soft tissues and spinal canal: No prevertebral fluid or swelling. No visible canal hematoma. Disc levels: There is severe spinal canal stenosis at the C4 level secondary to ossification of the posterior longitudinal ligament. Multilevel degenerative disc and facet disease. Upper chest: No pneumothorax, pulmonary nodule or pleural effusion. Other: Normal visualized paraspinal cervical soft tissues. IMPRESSION: 1. Chronic ischemic microangiopathy without acute intracranial abnormality. 2. No acute fracture or static subluxation of the cervical spine. 3. 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 Electronically Signed   By: Deatra RobinsonKevin  Herman M.D.   On: 10/11/2018 01:26   Ct Shoulder Left Wo Contrast  Result Date: 10/11/2018 CLINICAL DATA:  Post fall with left shoulder pain. Glenoid fracture and possible humeral fracture on radiograph. EXAM: CT OF THE UPPER LEFT EXTREMITY WITHOUT CONTRAST TECHNIQUE: Multidetector CT imaging of the upper left extremity was performed according to the standard protocol. COMPARISON:  Radiographs yesterday. FINDINGS: Bones/Joint/Cartilage Displaced curvilinear inferior glenoid fracture with fracture involving the anterior inferior glenoid measuring approximately 14x 25 mm. Fracture extends posteriorly to involve the posterior glenoid cortex with minimally displaced fracture fragment. No associated proximal humerus fracture, radiographic findings were likely artifactual. No fracture involvement of the scapular spine or chromium. Acromioclavicular joint is congruent with mild degenerative change. Ligaments Suboptimally assessed by CT. Muscles and Tendons Rotator cuff muscle bulk is maintained. Soft tissues  Mild soft tissue edema. No acute or suspicious findings in the included left hemithorax. IMPRESSION: 1. Displaced curvilinear inferior glenoid fracture. 2. No proximal humerus fracture, radiographic findings were artifactual. Electronically Signed   By: Narda RutherfordMelanie  Sanford M.D.   On:  10/11/2018 01:20   Dg Shoulder Left  Result Date: 10/10/2018 CLINICAL DATA:  Patient with shoulder pain status post fall. EXAM: LEFT SHOULDER - 2+ VIEW COMPARISON:  None. FINDINGS: Examination limited secondary to patient discomfort. Displaced fracture along the inferior aspect of the glenoid. AC joint degenerative changes. Visualized left hemithorax is unremarkable. Possible cortical irregularity involving the proximal humerus on the axillary and Y views. IMPRESSION: 1. Displaced fracture along the inferior aspect of the glenoid. 2. Possible cortical irregularity involving the proximal humerus on the axillary and Y views, proximal humerus fracture not excluded. Consider additional imaging (CT) as clinically indicated. Electronically Signed   By: Annia Belt M.D.   On: 10/10/2018 19:25   Dg Knee Complete 4 Views Left  Result Date: 10/10/2018 CLINICAL DATA:  63 year old male with fall and left knee pain. EXAM: LEFT KNEE - COMPLETE 4+ VIEW COMPARISON:  Right knee radiograph dated 10/10/2018 FINDINGS: There is no acute fracture or dislocation. The bones are mildly osteopenic. There is moderate arthritic changes of the knee with joint space narrowing and bone spurring. Desiccation of the medial meniscus with vacuum phenomena. No joint effusion. The soft tissues are unremarkable. IMPRESSION: 1. No acute fracture or dislocation. 2. Moderate arthritic changes of the knee. Electronically Signed   By: Elgie Collard M.D.   On: 10/10/2018 19:19   Dg Knee Complete 4 Views Right  Result Date: 10/10/2018 CLINICAL DATA:  Acute RIGHT knee pain following fall. Initial encounter. History of RIGHT knee replacement. EXAM: RIGHT KNEE - COMPLETE  4+ VIEW COMPARISON:  None. FINDINGS: RIGHT total knee arthroplasty changes noted. No acute fracture or dislocation. No complicating hardware features noted. No joint effusion. IMPRESSION: RIGHT total knee arthroplasty.  No acute bony abnormalities. Electronically Signed   By: Harmon Pier M.D.   On: 10/10/2018 19:19    Procedures Procedures (including critical care time)  Medications Ordered in ED Medications  oxyCODONE-acetaminophen (PERCOCET/ROXICET) 5-325 MG per tablet 1 tablet (1 tablet Oral Given 10/11/18 0107)     Initial Impression / Assessment and Plan / ED Course  I have reviewed the triage vital signs and the nursing notes.  Pertinent labs & imaging results that were available during my care of the patient were reviewed by me and considered in my medical decision making (see chart for details).      Patient was given Percocet for pain.  After my exam he was sent back to radiology to get a CT of his head, cervical spine and his left shoulder.  Patient was given the results of his CT scan, he was referred back to [facility] orthoped[facility].  He had already been placed in a sling.  Final Clinical Impressions(s) / ED Diagnoses   Final diagnoses:  Fall, initial encounter  Contusion of other part of head, initial encounter  Contusion of right knee, initial encounter  Contusion of left knee, initial encounter  Closed fracture of rim of glenoid fossa of left scapula, initial encounter    ED Discharge Orders         Ordered    oxyCODONE-acetaminophen (PERCOCET/ROXICET) 5-325 MG tablet  Every 4 hours PRN     10/11/18 0142          Plan discharge  Devoria Albe, MD, Concha Pyo, MD 10/11/18 0145

## 2020-01-20 IMAGING — CT CT SHOULDER*L* W/O CM
3 of 9 series · 7 of 33 positions shown, 8 images · non-contrast
Comparison: Radiographs yesterday.

CLINICAL DATA: Post fall with left shoulder pain. Glenoid fracture
and possible humeral fracture on radiograph.

EXAM:
CT OF THE UPPER LEFT EXTREMITY WITHOUT CONTRAST
TECHNIQUE: Multidetector CT imaging of the upper left extremity was performed
according to the standard protocol.

[Series 11: sagittal bone · sagittal · 0.25mm/px · 4 of 61 slices shown]
[im 13/61  bone]
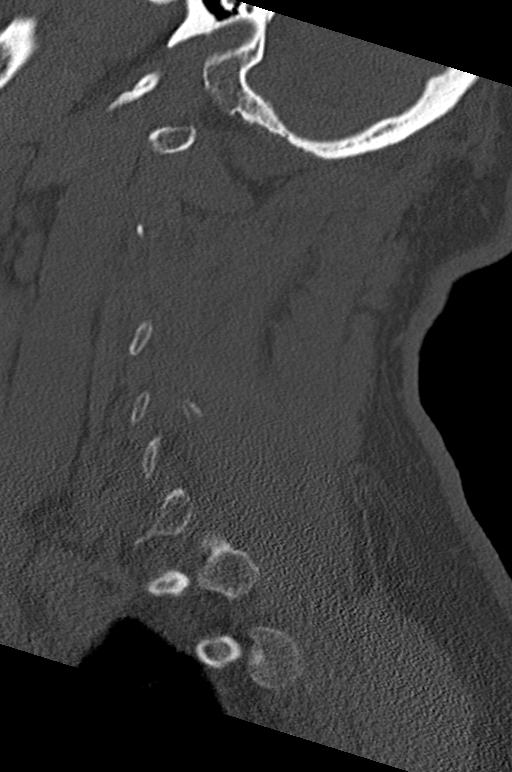
[im 25/61  bone]
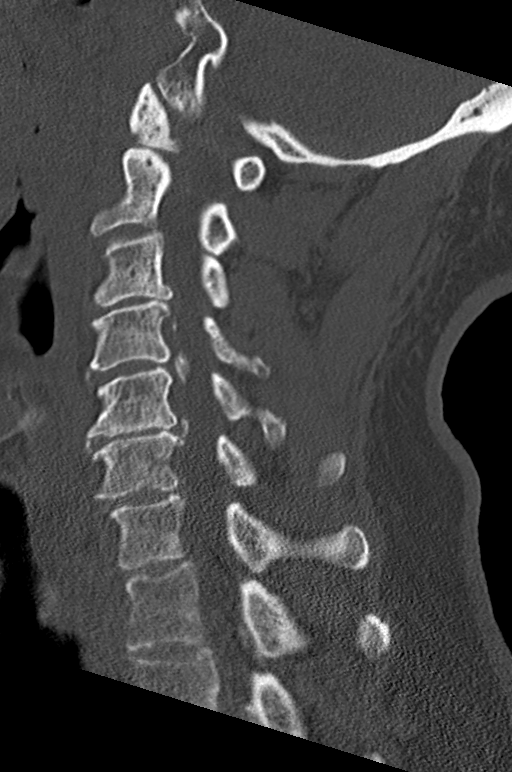
[im 37/61  bone]
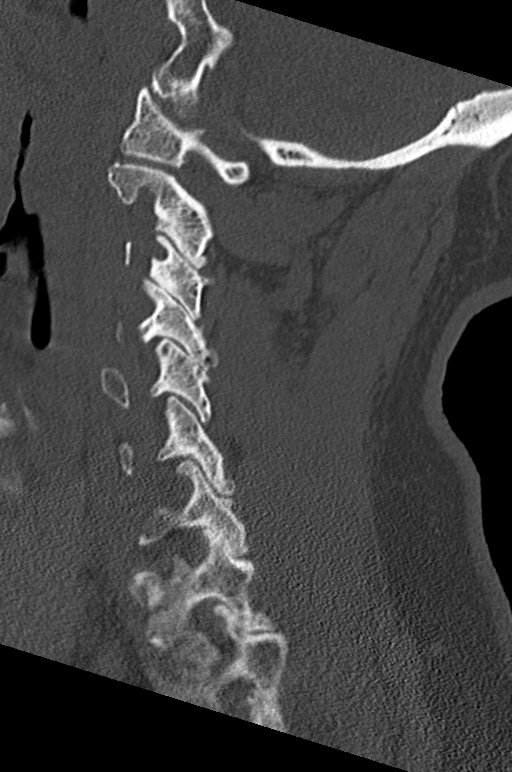
[im 49/61  bone]
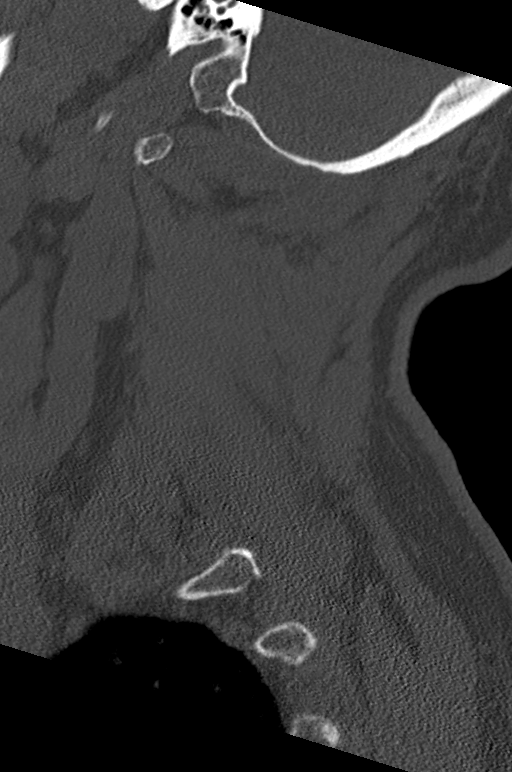

[Series 12: coronal bone · coronal · 0.25mm/px · 1 of 61 slices shown]
[im 31/61  bone]
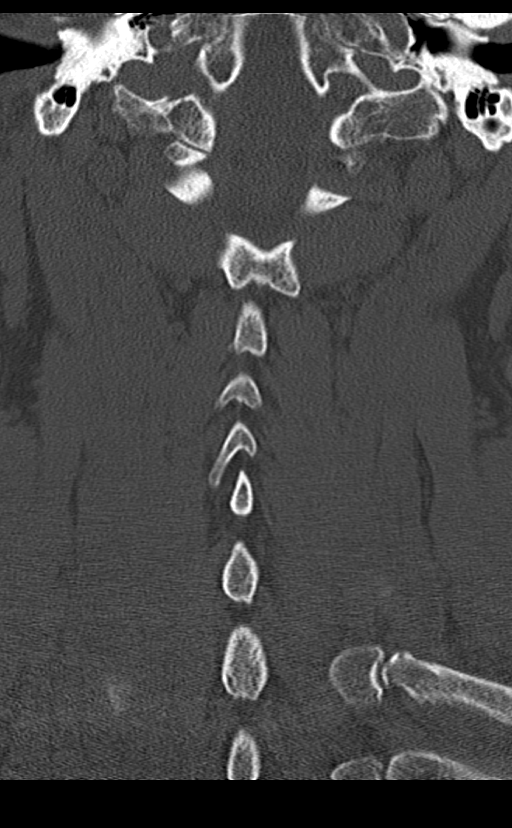

[Series 19: ax st · axial · 0.23mm/px · z∈[-14,+48]mm · 2 of 102 slices shown, 3 images]
[im 34/102  soft-tissue]
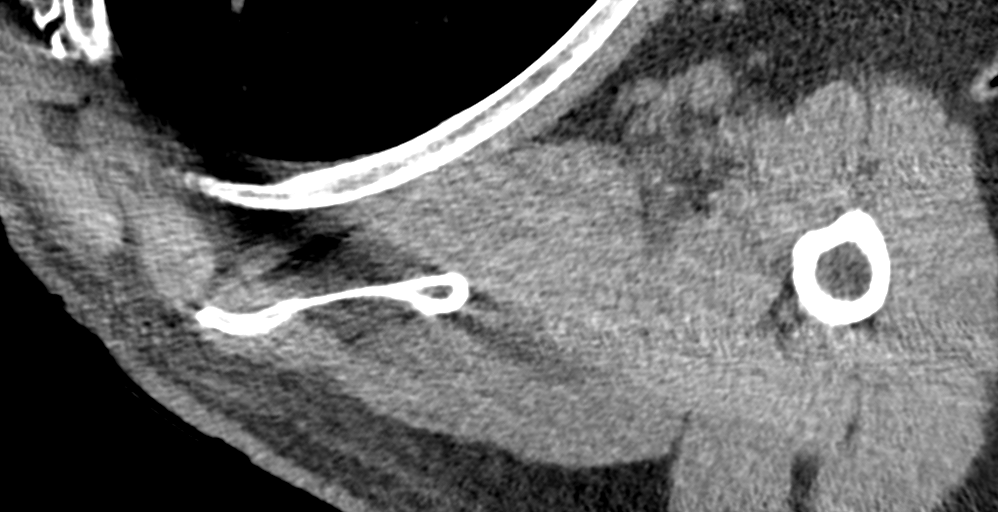
[im 34/102  bone]
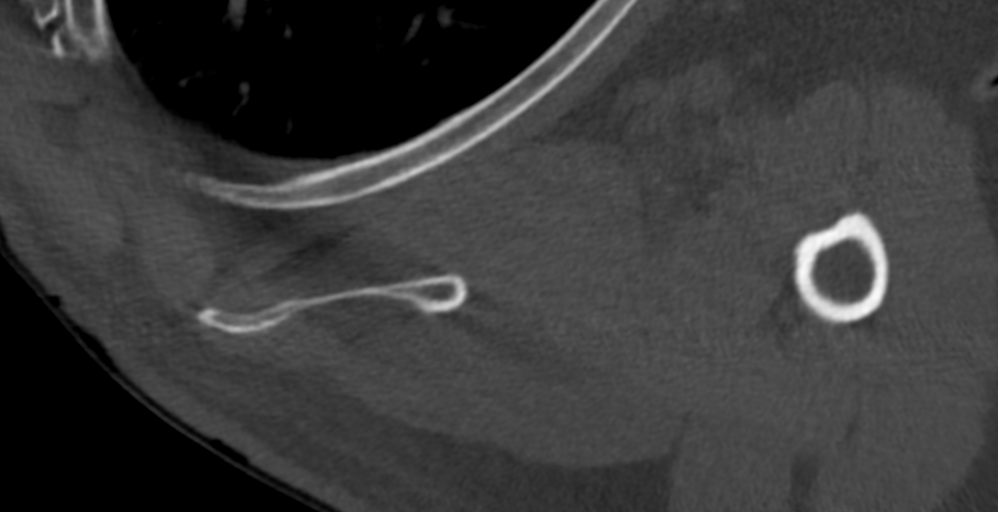
[im 68/102  bone]
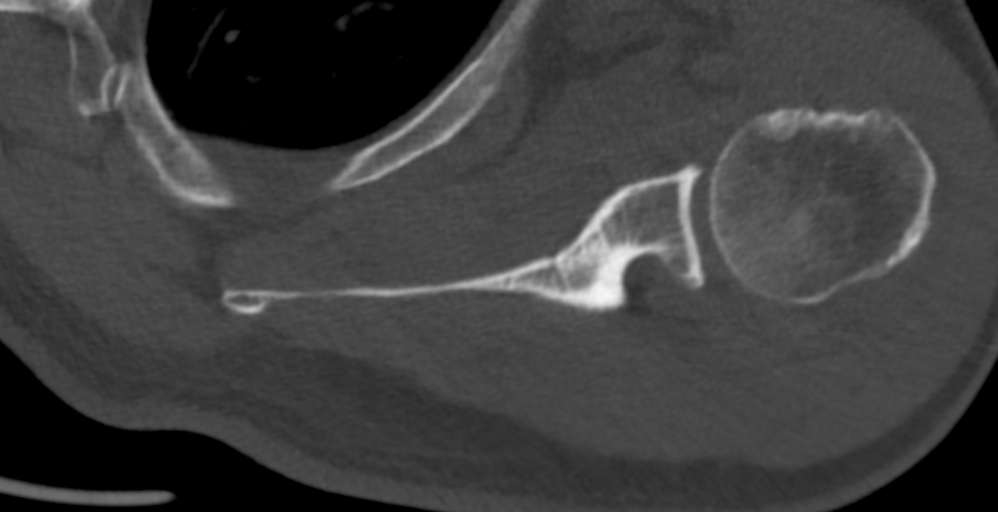

[7 of 33 positions shown; findings below may reference images not displayed]

FINDINGS: Bones/Joint/Cartilage

Displaced curvilinear inferior glenoid fracture with fracture
involving the anterior inferior glenoid measuring approximately 14x
25 mm. Fracture extends posteriorly to involve the posterior glenoid
cortex with minimally displaced fracture fragment. No associated
proximal humerus fracture, radiographic findings were likely
artifactual. No fracture involvement of the scapular spine or
chromium. Acromioclavicular joint is congruent with mild
degenerative change.

Ligaments

Suboptimally assessed by CT.

Muscles and Tendons

Rotator cuff muscle bulk is maintained.

Soft tissues

Mild soft tissue edema. No acute or suspicious findings in the
included left hemithorax.
IMPRESSION: 1. Displaced curvilinear inferior glenoid fracture.
2. No proximal humerus fracture, radiographic findings were
artifactual.

## 2020-01-20 IMAGING — CT CT HEAD W/O CM
4 of 8 series · 16 of 47 positions shown, 18 images · non-contrast
Comparison: None.

CLINICAL DATA: Fall with head trauma

EXAM:
CT HEAD WITHOUT CONTRAST
CT CERVICAL SPINE WITHOUT CONTRAST
TECHNIQUE: Multidetector CT imaging of the head and cervical spine was
performed following the standard protocol without intravenous
contrast. Multiplanar CT image reconstructions of the cervical spine
were also generated.

[Series 6: head bone · axial · 0.43mm/px · z∈[+218,+282]mm · 4 of 85 slices shown]
[im 11/85  bone]
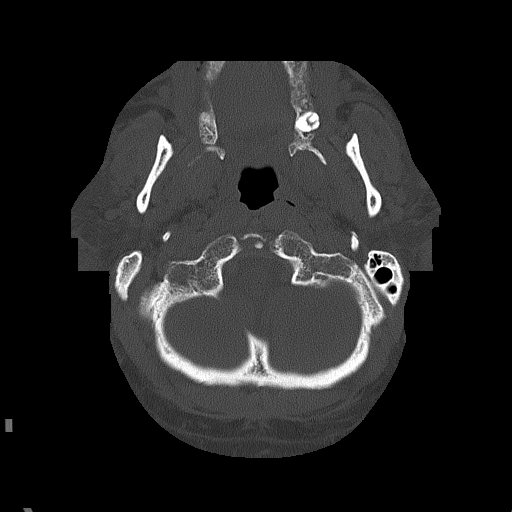
[im 22/85  bone]
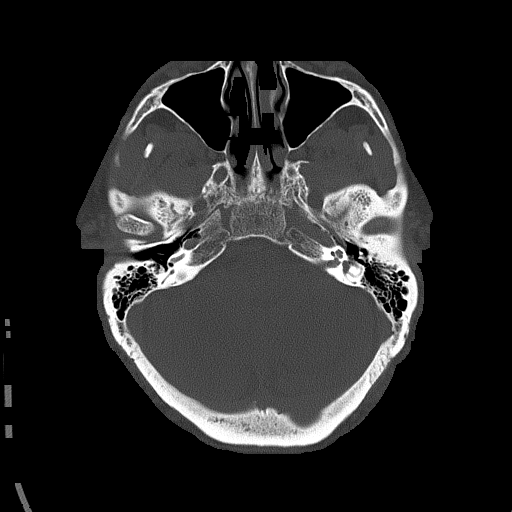
[im 32/85  bone]
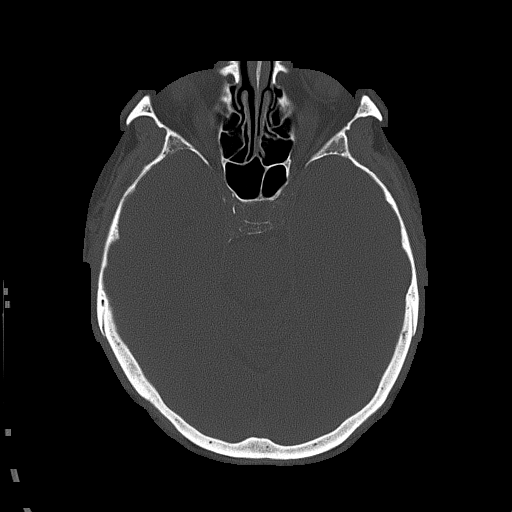
[im 43/85  bone]
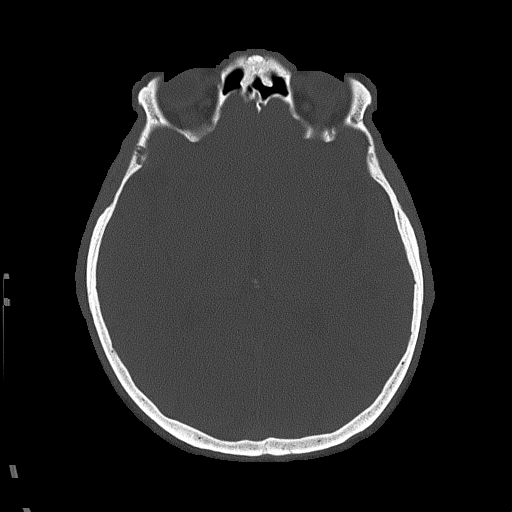

[Series 7: coronal soft · coronal · 0.33mm/px · 3 of 72 slices shown]
[im 2/72  brain]
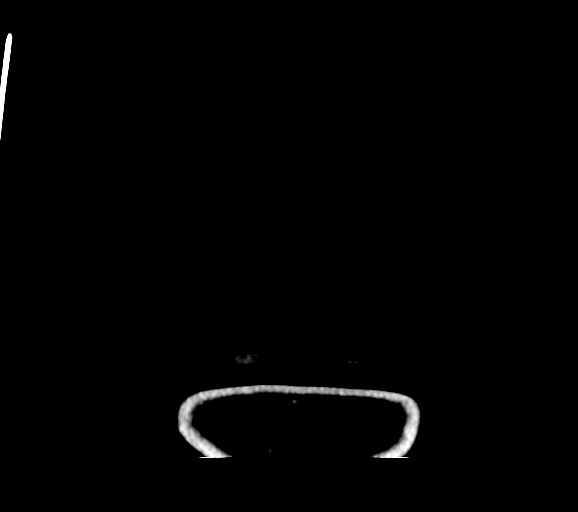
[im 4/72  brain]
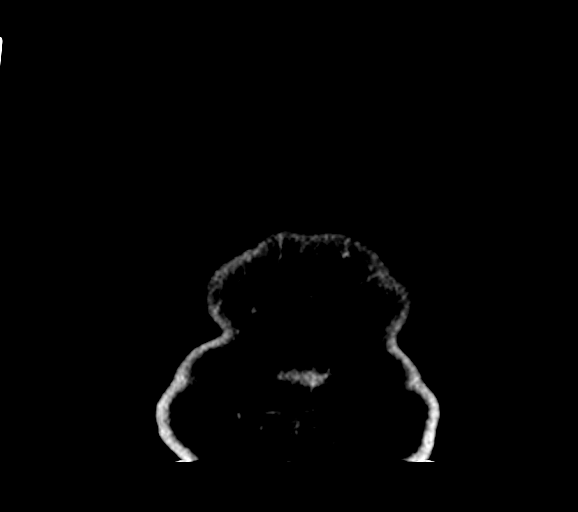
[im 5/72  brain]
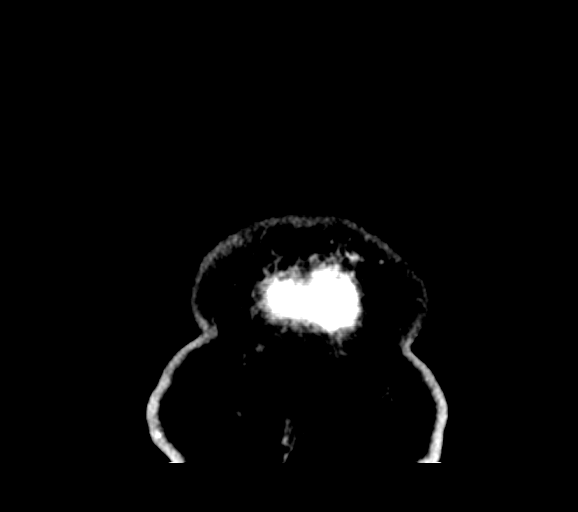

[Series 8: sagittal soft · sagittal · 0.36mm/px · 2 of 65 slices shown]
[im 22/65  brain]
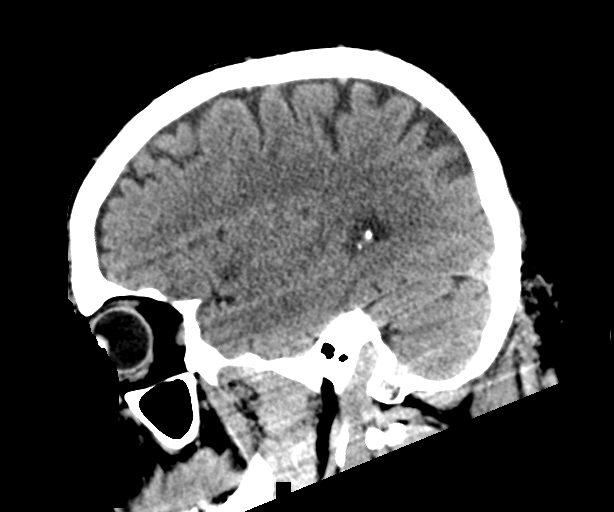
[im 43/65  brain]
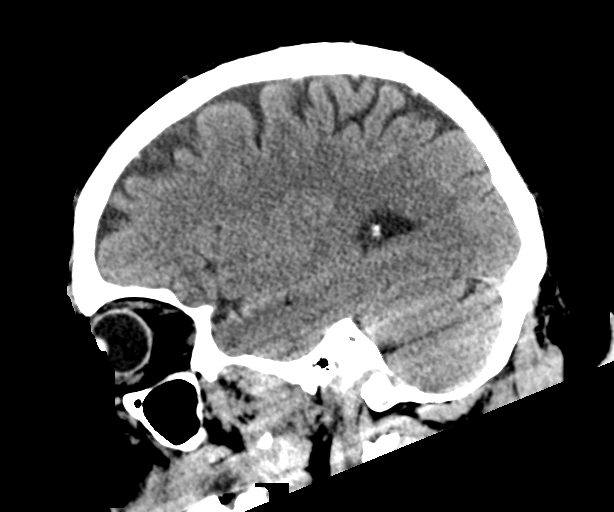

[Series 13: orthogonal axials · axial · 0.21mm/px · z∈[+68,+189]mm · 7 of 87 slices shown, 9 images]
[im 11/87  brain]
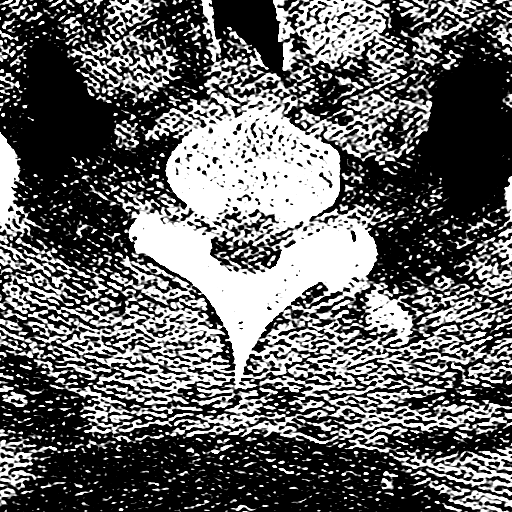
[im 11/87  bone]
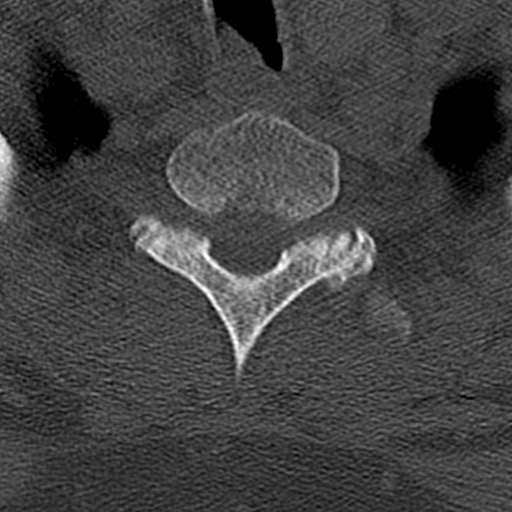
[im 22/87  brain]
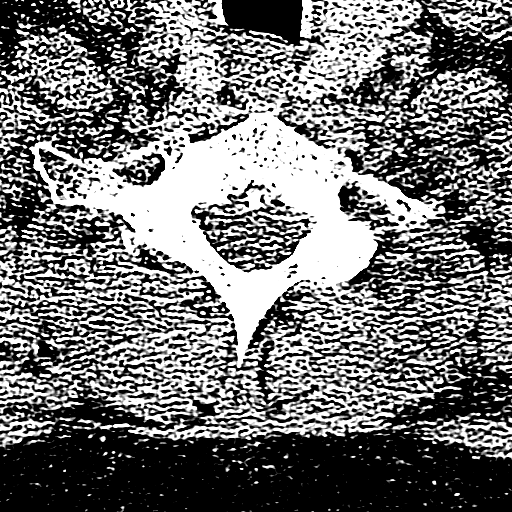
[im 33/87  brain]
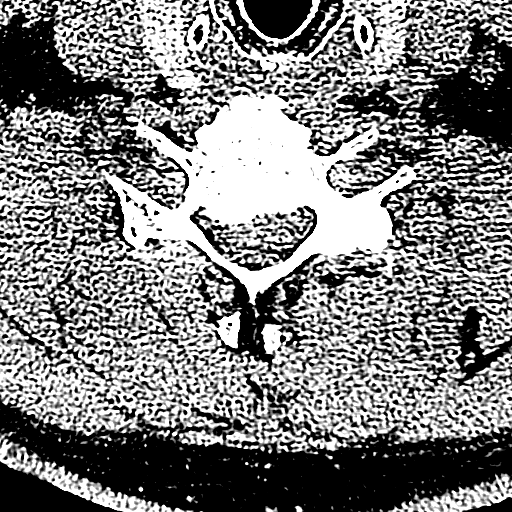
[im 44/87  brain]
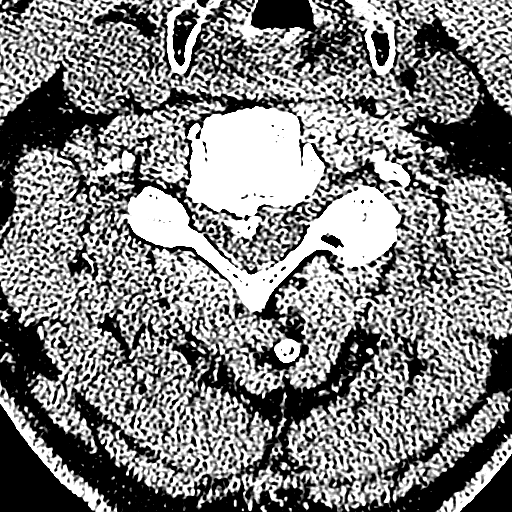
[im 54/87  brain]
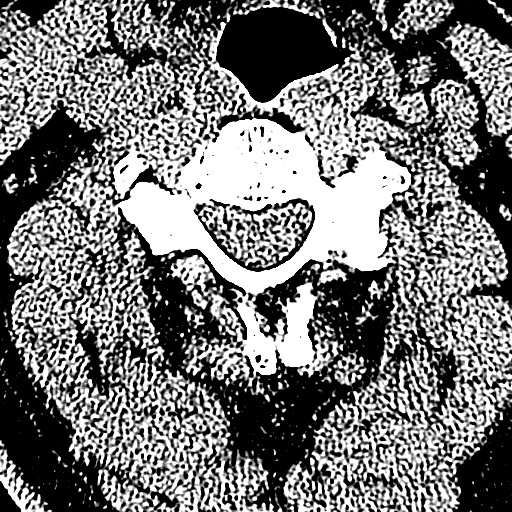
[im 54/87  bone]
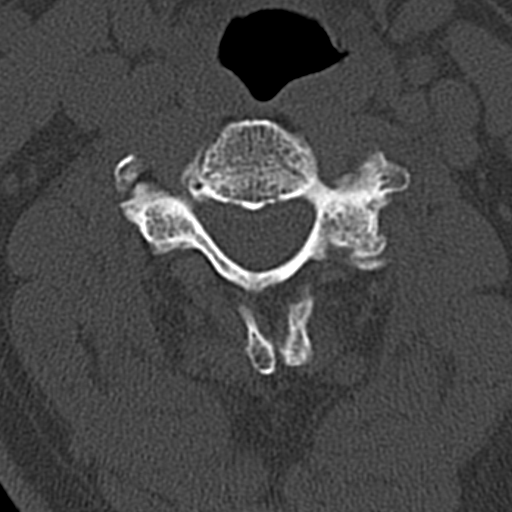
[im 65/87  brain]
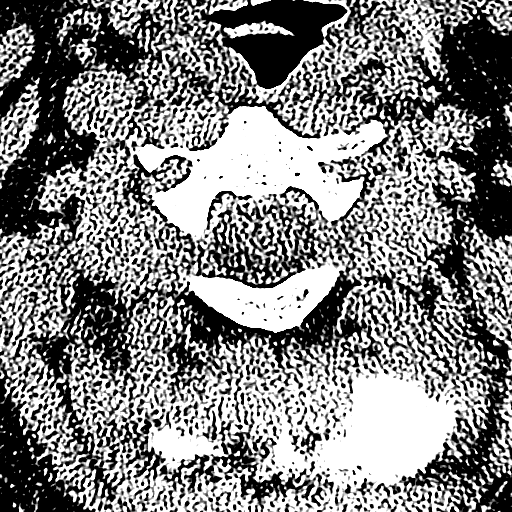
[im 76/87  brain]
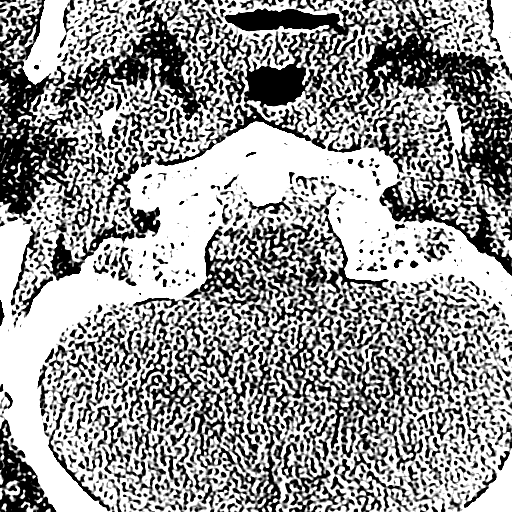

[16 of 47 positions shown; findings below may reference images not displayed]

FINDINGS: CT HEAD FINDINGS

Brain: There is no mass, hemorrhage or extra-axial collection. The
size and configuration of the ventricles and extra-axial CSF spaces
are normal. There is hypoattenuation of the periventricular white
matter, most commonly indicating chronic ischemic microangiopathy.

Vascular: No abnormal hyperdensity of the major intracranial
arteries or dural venous sinuses. No intracranial atherosclerosis.

Skull: The visualized skull base, calvarium and extracranial soft
tissues are normal.

Sinuses/Orbits: No fluid levels or advanced mucosal thickening of
the visualized paranasal sinuses. No mastoid or middle ear effusion.
The orbits are normal.

CT CERVICAL SPINE FINDINGS

Alignment: No static subluxation. Facets are aligned. Occipital
condyles are normally positioned.

Skull base and vertebrae: No acute fracture.

Soft tissues and spinal canal: No prevertebral fluid or swelling. No
visible canal hematoma.

Disc levels: There is severe spinal canal stenosis at the C4 level
secondary to ossification of the posterior longitudinal ligament.
Multilevel degenerative disc and facet disease.

Upper chest: No pneumothorax, pulmonary nodule or pleural effusion.

Other: Normal visualized paraspinal cervical soft tissues.
IMPRESSION: 1. Chronic ischemic microangiopathy without acute intracranial
abnormality.
2. No acute fracture or static subluxation of the cervical spine.
3. Severe C4 spinal canal stenosis secondary to ossification of the
posterior longitudinal ligament.

## 2022-06-11 ENCOUNTER — Other Ambulatory Visit: Payer: Self-pay | Admitting: Rehabilitation

## 2022-06-11 DIAGNOSIS — M47816 Spondylosis without myelopathy or radiculopathy, lumbar region: Secondary | ICD-10-CM

## 2022-07-20 ENCOUNTER — Ambulatory Visit
Admission: RE | Admit: 2022-07-20 | Discharge: 2022-07-20 | Disposition: A | Discharge status: Home / Self Care | Payer: Medicare Other | Source: Ambulatory Visit | Attending: Rehabilitation | Admitting: Rehabilitation

## 2022-07-20 DIAGNOSIS — M47816 Spondylosis without myelopathy or radiculopathy, lumbar region: Secondary | ICD-10-CM
